# Patient Record
Sex: Male | Born: 2000 | Hispanic: No | Marital: Single | Smoking: Never smoker
Health system: Southern US, Community
[De-identification: ages and names within clinical notes are randomized; demographics above are authoritative.]

## PROBLEM LIST (undated history)

## (undated) DIAGNOSIS — Z789 Other specified health status: Secondary | ICD-10-CM

## (undated) HISTORY — PX: NO PAST SURGERIES: SHX2092

---
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
## 2020-04-13 ENCOUNTER — Telehealth: Payer: Self-pay | Admitting: Orthopaedic Surgery

## 2020-04-13 NOTE — Telephone Encounter (Signed)
Hey. Tried to call patient's mother back. No answer.  I'm not sure what the patient is needing to be scheduled for, but please schedule with the provider of patient's choice. Patient has not been seen with our practice in over 3 years.

## 2020-04-13 NOTE — Telephone Encounter (Signed)
Patient's mom Siddiga called stating her son sees Dr. Cleophas Dunker. I did not make an appt for [facility]. I only see Dr. August Saucer and Dr. Lajoyce Corners on patient's chart. Please call patient about this matter at [phone].

## 2020-04-23 ENCOUNTER — Ambulatory Visit: Payer: Medicaid Other | Admitting: Orthopaedic Surgery

## 2020-04-29 ENCOUNTER — Other Ambulatory Visit: Payer: Self-pay

## 2020-04-29 ENCOUNTER — Encounter: Payer: Self-pay | Admitting: Orthopaedic Surgery

## 2020-04-29 ENCOUNTER — Ambulatory Visit: Payer: Self-pay

## 2020-04-29 ENCOUNTER — Ambulatory Visit (INDEPENDENT_AMBULATORY_CARE_PROVIDER_SITE_OTHER): Payer: Medicaid Other | Admitting: Orthopaedic Surgery

## 2020-04-29 VITALS — Ht 71.0 in | Wt 180.0 lb

## 2020-04-29 DIAGNOSIS — M25562 Pain in left knee: Secondary | ICD-10-CM | POA: Diagnosis not present

## 2020-04-29 DIAGNOSIS — M542 Cervicalgia: Secondary | ICD-10-CM

## 2020-04-29 NOTE — Progress Notes (Signed)
Office Visit Note   Patient: Jose Dennis           Date of Birth: Nov 21, 2000           MRN: [id number] Visit Date: 04/29/2020              Requested by: No referring provider defined for this encounter. PCP: Pcp, No   Assessment & Plan: Visit Diagnoses:  1. Neck pain   2. Acute pain of left knee     Plan: Acute onset left knee pain after playing soccer and late October.  Initially seen at [facility] with negative x-rays.  Continues to have pain with certain activities and a feeling of instability.  I suspect he may have a tear of the ACL and will order an MRI scan  Follow-Up Instructions: Return After MRI scan left knee.   Orders:  Orders Placed This Encounter  Procedures  . MR Knee Left w/o contrast   No orders of the defined types were placed in this encounter.     Procedures: No procedures performed   Clinical Data: No additional findings.   Subjective: Chief Complaint  Patient presents with  . Left Knee - Pain  Patient presents today for left knee pain. He said that one month ago he was playing soccer. He came to a hard stop and the goal keeper hit his knee. He had immediate pain. He continues to have pain laterally with flexion or jumping. He did go to an urgent care in [place] and had x-rays taken. He was told it was all normal. He is not taking anything for pain. He does not want to repeat his x-rays today.  Attends [employer].  Had some pain after his initial injury playing soccer but worse the day after.  He did have initial swelling.  Now the swelling has subsided but he just does not "trust" his left knee.  No problems with his knee prior to this injury  HPI  Review of Systems   Objective: Vital Signs: Ht 5\' 11"  (1.803 m)   Wt 180 lb (81.6 kg)   BMI 25.10 kg/m   Physical Exam Constitutional:      Appearance: He is well-developed and well-nourished.  HENT:     Mouth/Throat:     Mouth: Oropharynx is clear and moist.  Eyes:      Extraocular Movements: EOM normal.     Pupils: Pupils are equal, round, and reactive to light.  Pulmonary:     Effort: Pulmonary effort is normal.  Skin:    General: Skin is warm and dry.  Neurological:     Mental Status: He is alert and oriented to person, place, and time.  Psychiatric:        Mood and Affect: Mood and affect normal.        Behavior: Behavior normal.     Ortho Exam awake alert in no distress.  Left knee without effusion.  No opening with varus valgus stress.  A little bit of tenderness along the lateral joint.  I thought he had a slightly more anterior drawer sign and Lachman's than on the right knee.  Negative pivot shift.  No popliteal pain or mass.  No calf pain.  No patella pain  Specialty Comments:  No specialty comments available.  Imaging: No results found.   PMFS History: Patient Active Problem List   Diagnosis Date Noted  . Pain in left knee 04/29/2020  . Pain in left foot 07/07/2016   History  reviewed. No pertinent past medical history.  History reviewed. No pertinent family history.  History reviewed. No pertinent surgical history. Social History   Occupational History  . Not on file  Tobacco Use  . Smoking status: Never Smoker  . Smokeless tobacco: Never Used  Substance and Sexual Activity  . Alcohol use: No  . Drug use: No  . Sexual activity: Not on file

## 2020-05-21 ENCOUNTER — Ambulatory Visit
Admission: RE | Admit: 2020-05-21 | Discharge: 2020-05-21 | Disposition: A | Discharge status: Home / Self Care | Payer: Medicaid Other | Source: Ambulatory Visit | Attending: Orthopaedic Surgery | Admitting: Orthopaedic Surgery

## 2020-05-21 ENCOUNTER — Other Ambulatory Visit: Payer: Self-pay

## 2020-05-21 DIAGNOSIS — M25562 Pain in left knee: Secondary | ICD-10-CM

## 2020-05-28 ENCOUNTER — Ambulatory Visit (INDEPENDENT_AMBULATORY_CARE_PROVIDER_SITE_OTHER): Payer: Medicaid Other | Admitting: Orthopaedic Surgery

## 2020-05-28 ENCOUNTER — Other Ambulatory Visit: Payer: Self-pay

## 2020-05-28 ENCOUNTER — Encounter: Payer: Self-pay | Admitting: Orthopaedic Surgery

## 2020-05-28 DIAGNOSIS — S83512D Sprain of anterior cruciate ligament of left knee, subsequent encounter: Secondary | ICD-10-CM | POA: Diagnosis not present

## 2020-06-05 ENCOUNTER — Ambulatory Visit (INDEPENDENT_AMBULATORY_CARE_PROVIDER_SITE_OTHER): Payer: Medicaid Other | Admitting: Orthopedic Surgery

## 2020-06-05 ENCOUNTER — Encounter: Payer: Self-pay | Admitting: Surgical

## 2020-06-05 DIAGNOSIS — S83207D Unspecified tear of unspecified meniscus, current injury, left knee, subsequent encounter: Secondary | ICD-10-CM

## 2020-06-05 DIAGNOSIS — S83512D Sprain of anterior cruciate ligament of left knee, subsequent encounter: Secondary | ICD-10-CM

## 2020-06-06 ENCOUNTER — Encounter: Payer: Self-pay | Admitting: Orthopedic Surgery

## 2020-06-06 NOTE — Progress Notes (Signed)
Office Visit Note   Patient: Jose Dennis           Date of Birth: 12-14-2000           MRN: [id number] Visit Date: 06/05/2020 Requested by: No referring provider defined for this encounter. PCP: Pcp, No  Subjective: Chief Complaint  Patient presents with  . Left Knee - Pain    HPI: Jose Dennis is a 20 year old patient injured his knee playing soccer in October.  He has had several episodes of symptomatic instability since that time.  This was a contact injury.  He did hear a pop.  He has played some pickup basketball since that time but is able to diminish his level of aggression to keep his knee functional.  Denies any personal or family history of DVT or pulmonary embolism.  Currently he is a Consulting civil engineer at [employer].              ROS: All systems reviewed are negative as they relate to the chief complaint within the history of present illness.  Patient denies  fevers or chills.   Assessment & Plan: Visit Diagnoses:  1. Tears of meniscus and anterior cruciate ligament of left knee, subsequent encounter     Plan: Impression is left knee pain with ACL deficiency laxity and possible meniscal injury.  Review of the MRI scan shows slightly underwhelming evidence of meniscal injury although there is a little bit of signal there.  More compelling is his knee laxity on exam clinically and MRI scan evidence of ACL tear.  Discussed the risk and benefits of surgical intervention.  In general for young patient who is interested in continuing with high risk sports such as soccer and basketball ACL reconstruction is indicated.  Risk and benefits are discussed including not limited to infection nerve vessel damage knee stiffness incomplete healing as well as the prolonged recovery required.  And Jose Dennis's case I would favor quad autograft versus hamstring autograft.  I think quad autograft would be his best option.  Plan for surgery sometime at the completion of his school year.  In the meantime  I cautioned him extensively about doing any type of high risk cutting and pivoting activity.  I will see him back 7 days after the procedure.  Follow-Up Instructions: No follow-ups on file.   Orders:  No orders of the defined types were placed in this encounter.  No orders of the defined types were placed in this encounter.     Procedures: No procedures performed   Clinical Data: No additional findings.  Objective: Vital Signs: There were no vitals taken for this visit.  Physical Exam:   Constitutional: Patient appears well-developed HEENT:  Head: Normocephalic Eyes:EOM are normal Neck: Normal range of motion Cardiovascular: Normal rate Pulmonary/chest: Effort normal Neurologic: Patient is alert Skin: Skin is warm Psychiatric: Patient has normal mood and affect    Ortho Exam: Ortho exam demonstrates full range of motion of the left knee.  Does have ACL laxity with positive Lachman positive drawer.  No posterior lateral rotatory instability is noted.  Pedal pulse palpable.  Ankle dorsiflexion intact.  Collaterals are stable to varus valgus stress at 0 30 and 90 degrees.  No real focal joint line tenderness.  Specialty Comments:  No specialty comments available.  Imaging: No results found.   PMFS History: Patient Active Problem List   Diagnosis Date Noted  . Pain in left knee 04/29/2020  . Pain in left foot 07/07/2016   History reviewed. No  pertinent past medical history.  History reviewed. No pertinent family history.  History reviewed. No pertinent surgical history. Social History   Occupational History  . Not on file  Tobacco Use  . Smoking status: Never Smoker  . Smokeless tobacco: Never Used  Substance and Sexual Activity  . Alcohol use: No  . Drug use: No  . Sexual activity: Not on file

## 2020-06-09 DIAGNOSIS — S83512A Sprain of anterior cruciate ligament of left knee, initial encounter: Secondary | ICD-10-CM | POA: Insufficient documentation

## 2020-09-01 ENCOUNTER — Other Ambulatory Visit: Payer: Self-pay

## 2020-09-22 ENCOUNTER — Other Ambulatory Visit (HOSPITAL_COMMUNITY): Payer: Medicaid Other

## 2020-09-23 ENCOUNTER — Encounter (HOSPITAL_COMMUNITY): Payer: Self-pay | Admitting: Orthopedic Surgery

## 2020-09-23 NOTE — Anesthesia Preprocedure Evaluation (Addendum)
Anesthesia Evaluation  Patient identified by MRN, date of birth, ID band Patient awake    Reviewed: Allergy & Precautions, NPO status , Patient's Chart, lab work & pertinent test results  History of Anesthesia Complications Negative for: history of anesthetic complications  Airway Mallampati: II  TM Distance: >3 FB Neck ROM: Full    Dental no notable dental hx.    Pulmonary neg pulmonary ROS, Patient abstained from smoking.,    Pulmonary exam normal        Cardiovascular negative cardio ROS Normal cardiovascular exam     Neuro/Psych negative neurological ROS  negative psych ROS   GI/Hepatic negative GI ROS, Neg liver ROS,   Endo/Other  negative endocrine ROS  Renal/GU negative Renal ROS  negative genitourinary   Musculoskeletal Left ACL tear   Abdominal   Peds  Hematology negative hematology ROS (+)   Anesthesia Other Findings Day of surgery medications reviewed with patient.  Reproductive/Obstetrics negative OB ROS                            Anesthesia Physical Anesthesia Plan  ASA: I  Anesthesia Plan: General   Post-op Pain Management: GA combined w/ Regional for post-op pain   Induction: Intravenous  PONV Risk Score and Plan: 3 and Treatment may vary due to age or medical condition, Ondansetron, Dexamethasone and Midazolam  Airway Management Planned: Oral ETT  Additional Equipment: None  Intra-op Plan:   Post-operative Plan: Extubation in OR  Informed Consent: I have reviewed the patients History and Physical, chart, labs and discussed the procedure including the risks, benefits and alternatives for the proposed anesthesia with the patient or authorized representative who has indicated his/her understanding and acceptance.     Dental advisory given  Plan Discussed with: CRNA  Anesthesia Plan Comments:        Anesthesia Quick Evaluation

## 2020-09-23 NOTE — Progress Notes (Signed)
EKG: na CXR: na ECHO: denies Stress Test: denies Cardiac Cath: denies  Fasting Blood Sugar- na Checks Blood Sugar__na_ times a day  OSA/CPAP: No  ASA/Blood Thinner: No  No covid test needed  Anesthesia Review: No  Patient denies shortness of breath, fever, cough, and chest pain at PAT appointment.  Patient verbalized understanding of instructions provided today at the PAT appointment.  Patient asked to review instructions at home and day of surgery.

## 2020-09-24 ENCOUNTER — Other Ambulatory Visit: Payer: Self-pay

## 2020-09-24 ENCOUNTER — Ambulatory Visit (HOSPITAL_COMMUNITY): Payer: Medicaid Other | Admitting: Anesthesiology

## 2020-09-24 ENCOUNTER — Encounter (HOSPITAL_COMMUNITY)
Admission: RE | Disposition: A | Discharge status: Home / Self Care | Payer: Self-pay | Source: Home / Self Care | Attending: Orthopedic Surgery

## 2020-09-24 ENCOUNTER — Ambulatory Visit (HOSPITAL_COMMUNITY)
Admission: RE | Admit: 2020-09-24 | Discharge: 2020-09-24 | Disposition: A | Discharge status: Home / Self Care | Payer: Medicaid Other | Attending: Orthopedic Surgery | Admitting: Orthopedic Surgery

## 2020-09-24 ENCOUNTER — Encounter (HOSPITAL_COMMUNITY): Payer: Self-pay | Admitting: Orthopedic Surgery

## 2020-09-24 DIAGNOSIS — S83512A Sprain of anterior cruciate ligament of left knee, initial encounter: Secondary | ICD-10-CM | POA: Diagnosis present

## 2020-09-24 DIAGNOSIS — X58XXXA Exposure to other specified factors, initial encounter: Secondary | ICD-10-CM | POA: Diagnosis not present

## 2020-09-24 DIAGNOSIS — S83512D Sprain of anterior cruciate ligament of left knee, subsequent encounter: Secondary | ICD-10-CM

## 2020-09-24 DIAGNOSIS — S83282A Other tear of lateral meniscus, current injury, left knee, initial encounter: Secondary | ICD-10-CM | POA: Diagnosis not present

## 2020-09-24 DIAGNOSIS — S83282D Other tear of lateral meniscus, current injury, left knee, subsequent encounter: Secondary | ICD-10-CM

## 2020-09-24 DIAGNOSIS — S83242D Other tear of medial meniscus, current injury, left knee, subsequent encounter: Secondary | ICD-10-CM

## 2020-09-24 DIAGNOSIS — S83242A Other tear of medial meniscus, current injury, left knee, initial encounter: Secondary | ICD-10-CM | POA: Insufficient documentation

## 2020-09-24 DIAGNOSIS — Z20822 Contact with and (suspected) exposure to covid-19: Secondary | ICD-10-CM | POA: Insufficient documentation

## 2020-09-24 HISTORY — DX: Other specified health status: Z78.9

## 2020-09-24 HISTORY — PX: ANTERIOR CRUCIATE LIGAMENT REPAIR: SHX115

## 2020-09-24 LAB — CBC
HCT: 43 % (ref 39.0–52.0)
Hemoglobin: 14.9 g/dL (ref 13.0–17.0)
MCH: 31.2 pg (ref 26.0–34.0)
MCHC: 34.7 g/dL (ref 30.0–36.0)
MCV: 90 fL (ref 80.0–100.0)
Platelets: 236 10*3/uL (ref 150–400)
RBC: 4.78 MIL/uL (ref 4.22–5.81)
RDW: 11.9 % (ref 11.5–15.5)
WBC: 7 10*3/uL (ref 4.0–10.5)
nRBC: 0 % (ref 0.0–0.2)

## 2020-09-24 LAB — BASIC METABOLIC PANEL
Anion gap: 6 (ref 5–15)
BUN: 11 mg/dL (ref 6–20)
CO2: 22 mmol/L (ref 22–32)
Calcium: 8.8 mg/dL — ABNORMAL LOW (ref 8.9–10.3)
Chloride: 108 mmol/L (ref 98–111)
Creatinine, Ser: 0.71 mg/dL (ref 0.61–1.24)
GFR, Estimated: >60 mL/min (ref >60)
Glucose, Bld: 93 mg/dL (ref 70–99)
Potassium: 4.1 mmol/L (ref 3.5–5.1)
Sodium: 136 mmol/L (ref 135–145)

## 2020-09-24 LAB — RESP PANEL BY RT-PCR (FLU A&B, COVID) ARPGX2
Influenza A by PCR: NEGATIVE
Influenza B by PCR: NEGATIVE
SARS Coronavirus 2 by RT PCR: NEGATIVE

## 2020-09-24 SURGERY — RECONSTRUCTION, KNEE, ACL, USING HAMSTRING GRAFT
Anesthesia: General | Laterality: Left

## 2020-09-24 MED ORDER — BUPIVACAINE HCL 0.25 % IJ SOLN
INTRAMUSCULAR | Status: DC | PRN
  Administered 2020-09-24 (×2): 10 mL

## 2020-09-24 MED ORDER — EPINEPHRINE PF 1 MG/ML IJ SOLN
INTRAMUSCULAR | Status: AC
  Filled 2020-09-24: qty 4

## 2020-09-24 MED ORDER — EPINEPHRINE PF 1 MG/ML IJ SOLN
INTRAMUSCULAR | Status: AC
  Filled 2020-09-24: qty 2

## 2020-09-24 MED ORDER — BUPIVACAINE HCL (PF) 0.25 % IJ SOLN
INTRAMUSCULAR | Status: AC
  Filled 2020-09-24: qty 20

## 2020-09-24 MED ORDER — SODIUM CHLORIDE 0.9 % IR SOLN
Status: DC | PRN
  Administered 2020-09-24 (×3): 3000 mL

## 2020-09-24 MED ORDER — CEFAZOLIN SODIUM-DEXTROSE 2-4 GM/100ML-% IV SOLN
2.0000 g | INTRAVENOUS | Status: AC
  Administered 2020-09-24: 2 g via INTRAVENOUS
  Filled 2020-09-24: qty 100

## 2020-09-24 MED ORDER — POVIDONE-IODINE 10 % EX SWAB: 2.0000 "application " | Freq: Once | CUTANEOUS | Status: DC

## 2020-09-24 MED ORDER — KETOROLAC TROMETHAMINE 10 MG PO TABS: 10.0000 mg | ORAL_TABLET | Freq: Three times a day (TID) | ORAL | 0 refills | Status: AC | PRN

## 2020-09-24 MED ORDER — SODIUM CHLORIDE 0.9 % IR SOLN
Status: DC | PRN
  Administered 2020-09-24 (×4): 1 mL

## 2020-09-24 MED ORDER — VANCOMYCIN HCL 500 MG IV SOLR
INTRAVENOUS | Status: AC
  Filled 2020-09-24: qty 500

## 2020-09-24 MED ORDER — LIDOCAINE 2% (20 MG/ML) 5 ML SYRINGE
INTRAMUSCULAR | Status: DC | PRN
  Administered 2020-09-24: 100 mg via INTRAVENOUS

## 2020-09-24 MED ORDER — LIDOCAINE 2% (20 MG/ML) 5 ML SYRINGE
INTRAMUSCULAR | Status: AC
  Filled 2020-09-24: qty 5

## 2020-09-24 MED ORDER — BUPIVACAINE-EPINEPHRINE 0.25% -1:200000 IJ SOLN
INTRAMUSCULAR | Status: DC | PRN
  Administered 2020-09-24: 10 mL

## 2020-09-24 MED ORDER — DIPHENHYDRAMINE HCL 50 MG/ML IJ SOLN
INTRAMUSCULAR | Status: DC | PRN
  Administered 2020-09-24: 12.5 mg via INTRAVENOUS

## 2020-09-24 MED ORDER — VANCOMYCIN HCL 1000 MG IV SOLR
INTRAVENOUS | Status: AC
  Filled 2020-09-24: qty 1000

## 2020-09-24 MED ORDER — ROCURONIUM BROMIDE 10 MG/ML (PF) SYRINGE
PREFILLED_SYRINGE | INTRAVENOUS | Status: DC | PRN
  Administered 2020-09-24: 60 mg via INTRAVENOUS

## 2020-09-24 MED ORDER — POVIDONE-IODINE 10 % EX SWAB
2.0000 "application " | Freq: Once | CUTANEOUS | Status: AC
  Administered 2020-09-24: 2 via TOPICAL

## 2020-09-24 MED ORDER — POVIDONE-IODINE 7.5 % EX SOLN
Freq: Once | CUTANEOUS | Status: DC
  Filled 2020-09-24: qty 118

## 2020-09-24 MED ORDER — DIPHENHYDRAMINE HCL 50 MG/ML IJ SOLN
INTRAMUSCULAR | Status: AC
  Filled 2020-09-24: qty 1

## 2020-09-24 MED ORDER — HYDROMORPHONE HCL 1 MG/ML IJ SOLN: 0.2500 mg | INTRAMUSCULAR | Status: DC | PRN

## 2020-09-24 MED ORDER — BUPIVACAINE-EPINEPHRINE (PF) 0.5% -1:200000 IJ SOLN
INTRAMUSCULAR | Status: DC | PRN
  Administered 2020-09-24: 15 mL via PERINEURAL

## 2020-09-24 MED ORDER — 0.9 % SODIUM CHLORIDE (POUR BTL) OPTIME
TOPICAL | Status: DC | PRN
  Administered 2020-09-24: 1000 mL

## 2020-09-24 MED ORDER — MORPHINE SULFATE (PF) 4 MG/ML IV SOLN
INTRAVENOUS | Status: DC | PRN
  Administered 2020-09-24: 8 mg via INTRAVENOUS

## 2020-09-24 MED ORDER — OXYCODONE-ACETAMINOPHEN 5-325 MG PO TABS: 1.0000 | ORAL_TABLET | ORAL | 0 refills | Status: AC | PRN

## 2020-09-24 MED ORDER — CLONIDINE HCL (ANALGESIA) 100 MCG/ML EP SOLN
EPIDURAL | Status: AC
  Filled 2020-09-24: qty 10

## 2020-09-24 MED ORDER — ASPIRIN 81 MG PO CHEW: 81.0000 mg | CHEWABLE_TABLET | Freq: Every day | ORAL | 0 refills | Status: AC

## 2020-09-24 MED ORDER — ORAL CARE MOUTH RINSE: 15.0000 mL | Freq: Once | OROMUCOSAL | Status: AC

## 2020-09-24 MED ORDER — EPINEPHRINE PF 1 MG/ML IJ SOLN: INTRAMUSCULAR | Status: DC | PRN

## 2020-09-24 MED ORDER — CLONIDINE HCL (ANALGESIA) 100 MCG/ML EP SOLN
EPIDURAL | Status: DC | PRN
  Administered 2020-09-24: 1 mL

## 2020-09-24 MED ORDER — IRRISEPT - 450ML BOTTLE WITH 0.05% CHG IN STERILE WATER, USP 99.95% OPTIME
TOPICAL | Status: DC | PRN
  Administered 2020-09-24: 450 mL

## 2020-09-24 MED ORDER — FENTANYL CITRATE (PF) 100 MCG/2ML IJ SOLN: 50.0000 ug | Freq: Once | INTRAMUSCULAR | Status: AC

## 2020-09-24 MED ORDER — MIDAZOLAM HCL 2 MG/2ML IJ SOLN
INTRAMUSCULAR | Status: AC
  Filled 2020-09-24: qty 2

## 2020-09-24 MED ORDER — METHOCARBAMOL 500 MG PO TABS: 500.0000 mg | ORAL_TABLET | Freq: Three times a day (TID) | ORAL | 0 refills | Status: AC | PRN

## 2020-09-24 MED ORDER — CHLORHEXIDINE GLUCONATE 0.12 % MT SOLN
15.0000 mL | Freq: Once | OROMUCOSAL | Status: AC
  Administered 2020-09-24: 15 mL via OROMUCOSAL
  Filled 2020-09-24: qty 15

## 2020-09-24 MED ORDER — ROCURONIUM BROMIDE 10 MG/ML (PF) SYRINGE
PREFILLED_SYRINGE | INTRAVENOUS | Status: AC
  Filled 2020-09-24: qty 10

## 2020-09-24 MED ORDER — DEXAMETHASONE SODIUM PHOSPHATE 10 MG/ML IJ SOLN
INTRAMUSCULAR | Status: AC
  Filled 2020-09-24: qty 1

## 2020-09-24 MED ORDER — FENTANYL CITRATE (PF) 250 MCG/5ML IJ SOLN
INTRAMUSCULAR | Status: DC | PRN
  Administered 2020-09-24: 100 ug via INTRAVENOUS
  Administered 2020-09-24 (×2): 25 ug via INTRAVENOUS

## 2020-09-24 MED ORDER — PROPOFOL 10 MG/ML IV BOLUS
INTRAVENOUS | Status: DC | PRN
  Administered 2020-09-24: 200 mg via INTRAVENOUS

## 2020-09-24 MED ORDER — LACTATED RINGERS IV SOLN: INTRAVENOUS | Status: DC

## 2020-09-24 MED ORDER — PROMETHAZINE HCL 25 MG/ML IJ SOLN: 6.2500 mg | INTRAMUSCULAR | Status: DC | PRN

## 2020-09-24 MED ORDER — MIDAZOLAM HCL 2 MG/2ML IJ SOLN: 2.0000 mg | Freq: Once | INTRAMUSCULAR | Status: AC

## 2020-09-24 MED ORDER — ACETAMINOPHEN 500 MG PO TABS
1000.0000 mg | ORAL_TABLET | Freq: Once | ORAL | Status: AC
  Administered 2020-09-24: 1000 mg via ORAL
  Filled 2020-09-24: qty 2

## 2020-09-24 MED ORDER — DEXAMETHASONE SODIUM PHOSPHATE 10 MG/ML IJ SOLN
INTRAMUSCULAR | Status: DC | PRN
  Administered 2020-09-24: 5 mg via INTRAVENOUS

## 2020-09-24 MED ORDER — DEXMEDETOMIDINE (PRECEDEX) IN NS 20 MCG/5ML (4 MCG/ML) IV SYRINGE
PREFILLED_SYRINGE | INTRAVENOUS | Status: DC | PRN
  Administered 2020-09-24: 4 ug via INTRAVENOUS
  Administered 2020-09-24 (×2): 8 ug via INTRAVENOUS
  Administered 2020-09-24: 4 ug via INTRAVENOUS

## 2020-09-24 MED ORDER — CLONIDINE HCL (ANALGESIA) 100 MCG/ML EP SOLN
EPIDURAL | Status: DC | PRN
  Administered 2020-09-24: 100 ug

## 2020-09-24 MED ORDER — MIDAZOLAM HCL 5 MG/5ML IJ SOLN
INTRAMUSCULAR | Status: DC | PRN
  Administered 2020-09-24: 2 mg via INTRAVENOUS

## 2020-09-24 MED ORDER — MORPHINE SULFATE (PF) 4 MG/ML IV SOLN
INTRAVENOUS | Status: AC
  Filled 2020-09-24: qty 2

## 2020-09-24 MED ORDER — PROPOFOL 10 MG/ML IV BOLUS
INTRAVENOUS | Status: AC
  Filled 2020-09-24: qty 20

## 2020-09-24 MED ORDER — BUPIVACAINE-EPINEPHRINE (PF) 0.25% -1:200000 IJ SOLN
INTRAMUSCULAR | Status: AC
  Filled 2020-09-24: qty 30

## 2020-09-24 MED ORDER — SUGAMMADEX SODIUM 200 MG/2ML IV SOLN
INTRAVENOUS | Status: DC | PRN
  Administered 2020-09-24 (×2): 100 mg via INTRAVENOUS

## 2020-09-24 MED ORDER — FENTANYL CITRATE (PF) 250 MCG/5ML IJ SOLN
INTRAMUSCULAR | Status: AC
  Filled 2020-09-24: qty 5

## 2020-09-24 MED ORDER — ONDANSETRON HCL 4 MG/2ML IJ SOLN
INTRAMUSCULAR | Status: AC
  Filled 2020-09-24: qty 2

## 2020-09-24 MED ORDER — MIDAZOLAM HCL 2 MG/2ML IJ SOLN
INTRAMUSCULAR | Status: AC
  Administered 2020-09-24: 2 mg via INTRAVENOUS
  Filled 2020-09-24: qty 2

## 2020-09-24 MED ORDER — FENTANYL CITRATE (PF) 100 MCG/2ML IJ SOLN
INTRAMUSCULAR | Status: AC
  Administered 2020-09-24: 50 ug via INTRAVENOUS
  Filled 2020-09-24: qty 2

## 2020-09-24 MED ORDER — ONDANSETRON HCL 4 MG/2ML IJ SOLN
INTRAMUSCULAR | Status: DC | PRN
  Administered 2020-09-24: 4 mg via INTRAVENOUS

## 2020-09-24 MED ORDER — EPINEPHRINE PF 1 MG/ML IJ SOLN
INTRAMUSCULAR | Status: AC
  Filled 2020-09-24: qty 1

## 2020-09-24 SURGICAL SUPPLY — 107 items
ALCOHOL 70% 16 OZ (MISCELLANEOUS) ×2 IMPLANT
BANDAGE ESMARK 6X9 LF (GAUZE/BANDAGES/DRESSINGS) IMPLANT
BLADE SURG 10 STRL SS (BLADE) ×2 IMPLANT
BLADE SURG 15 STRL LF DISP TIS (BLADE) ×2 IMPLANT
BLADE SURG 15 STRL SS (BLADE) ×4
BNDG ELASTIC 6X15 VLCR STRL LF (GAUZE/BANDAGES/DRESSINGS) ×2 IMPLANT
BNDG ESMARK 6X9 LF (GAUZE/BANDAGES/DRESSINGS)
BURR OVAL 8 FLU 4.0X13 (MISCELLANEOUS) IMPLANT
CANN ZONE NAVIGATOR LT DISP (ORTHOPEDIC DISPOSABLE SUPPLIES) ×1
CANNULA ZONE NAVIGATOR LT DISP (ORTHOPEDIC DISPOSABLE SUPPLIES) ×1 IMPLANT
COVER MAYO STAND STRL (DRAPES) ×2 IMPLANT
COVER SURGICAL LIGHT HANDLE (MISCELLANEOUS) ×2 IMPLANT
COVER WAND RF STERILE (DRAPES) ×2 IMPLANT
CUFF TOURN SGL QUICK 34 (TOURNIQUET CUFF)
CUFF TRNQT CYL 34X4.125X (TOURNIQUET CUFF) IMPLANT
CUTTER TENSIONER SUT 2-0 0 FBW (INSTRUMENTS) ×2 IMPLANT
DECANTER SPIKE VIAL GLASS SM (MISCELLANEOUS) IMPLANT
DISSECTOR 4.0MM X 13CM (MISCELLANEOUS) ×2 IMPLANT
DRAPE ARTHROSCOPY W/POUCH 114 (DRAPES) ×2 IMPLANT
DRAPE INCISE IOBAN 66X45 STRL (DRAPES) ×2 IMPLANT
DRAPE OEC MINIVIEW 54X84 (DRAPES) ×2 IMPLANT
DRAPE ORTHO SPLIT 77X108 STRL (DRAPES) ×2
DRAPE SURG ORHT 6 SPLT 77X108 (DRAPES) ×1 IMPLANT
DRAPE U-SHAPE 47X51 STRL (DRAPES) ×2 IMPLANT
DRILL FLIPCUTTER II 7.5MM (MISCELLANEOUS) IMPLANT
DRILL FLIPCUTTER II 8.0MM (INSTRUMENTS) IMPLANT
DRILL FLIPCUTTER II 8.5MM (INSTRUMENTS) IMPLANT
DRILL FLIPCUTTER II 9.0MM (INSTRUMENTS) IMPLANT
DRILL FLIPCUTTER III 6-12 (ORTHOPEDIC DISPOSABLE SUPPLIES) ×1 IMPLANT
DRSG PAD ABDOMINAL 8X10 ST (GAUZE/BANDAGES/DRESSINGS) IMPLANT
DRSG TEGADERM 4X4.75 (GAUZE/BANDAGES/DRESSINGS) ×14 IMPLANT
DURAPREP 26ML APPLICATOR (WOUND CARE) ×4 IMPLANT
DW OUTFLOW CASSETTE/TUBE SET (MISCELLANEOUS) ×2 IMPLANT
ELECT REM PT RETURN 9FT ADLT (ELECTROSURGICAL) ×2
ELECTRODE REM PT RTRN 9FT ADLT (ELECTROSURGICAL) ×1 IMPLANT
FIBER BONE ALLOSYNC EXPAND 5 (Bone Implant) ×2 IMPLANT
FLIPCUTTER II 7.5MM (MISCELLANEOUS)
FLIPCUTTER II 8.0MM (INSTRUMENTS)
FLIPCUTTER II 8.5MM (INSTRUMENTS)
FLIPCUTTER II 9.0MM (INSTRUMENTS)
FLIPCUTTER III 6-12 AR-1204FF (ORTHOPEDIC DISPOSABLE SUPPLIES) ×2
GAUZE SPONGE 4X4 12PLY STRL LF (GAUZE/BANDAGES/DRESSINGS) ×2 IMPLANT
GAUZE XEROFORM 1X8 LF (GAUZE/BANDAGES/DRESSINGS) ×4 IMPLANT
GLOVE ECLIPSE 8.0 STRL XLNG CF (GLOVE) ×2 IMPLANT
GLOVE ORTHO TXT STRL SZ7.5 (GLOVE) ×2 IMPLANT
GLOVE SRG 8 PF TXTR STRL LF DI (GLOVE) ×1 IMPLANT
GLOVE SURG UNDER POLY LF SZ8 (GLOVE) ×2
GOWN STRL REUS W/ TWL LRG LVL3 (GOWN DISPOSABLE) ×3 IMPLANT
GOWN STRL REUS W/TWL LRG LVL3 (GOWN DISPOSABLE) ×6
HANDLE ZONE NAVIGATOR (ORTHOPEDIC DISPOSABLE SUPPLIES) ×1 IMPLANT
IMMOBILIZER KNEE 22 UNIV (SOFTGOODS) ×2 IMPLANT
IMPL FIBERSTICH 2-0 CVD (Anchor) ×1 IMPLANT
IMPL TIGHTROP ABS ACL FIBERTG (Orthopedic Implant) ×1 IMPLANT
IMPL TIGHTROP FIBERTAG ACL (Orthopedic Implant) ×1 IMPLANT
IMPL TIGHTROPE ABS ACL FIBERTG (Orthopedic Implant) ×2 IMPLANT
IMPLANT FIBERSTICH 2-0 CVD (Anchor) ×2 IMPLANT
IMPLANT TIGHTROPE FIBERTAG ACL (Orthopedic Implant) ×2 IMPLANT
KIT BASIN OR (CUSTOM PROCEDURE TRAY) ×2 IMPLANT
KIT BIOCARTILAGE DEL W/SYRINGE (KITS) IMPLANT
KIT BIOCARTILAGE LG JOINT MIX (KITS) ×2 IMPLANT
KIT BUTTON TIGHTROPE ABS 8X12 (Anchor) ×2 IMPLANT
KIT ROOT REPAIR MEINISCAL PEEK (Anchor) ×1 IMPLANT
KIT TURNOVER KIT B (KITS) ×2 IMPLANT
KNIFE GRAFT ACL 9MM (MISCELLANEOUS) ×2 IMPLANT
MANIFOLD NEPTUNE II (INSTRUMENTS) ×2 IMPLANT
MEINISCAL ROOT REPAIR KIT PEEK (Anchor) ×2 IMPLANT
MID POST LF CANNULA AR7910L (ORTHOPEDIC DISPOSABLE SUPPLIES) ×2
NEEDLE 18GX1X1/2 (RX/OR ONLY) (NEEDLE) ×2 IMPLANT
NEEDLE HYPO 18GX1.5 BLUNT FILL (NEEDLE) ×2 IMPLANT
NS IRRIG 1000ML POUR BTL (IV SOLUTION) ×2 IMPLANT
PACK ARTHROSCOPY DSU (CUSTOM PROCEDURE TRAY) ×2 IMPLANT
PAD ABD 8X10 STRL (GAUZE/BANDAGES/DRESSINGS) ×2 IMPLANT
PAD ARMBOARD 7.5X6 YLW CONV (MISCELLANEOUS) ×4 IMPLANT
PAD CAST 4YDX4 CTTN HI CHSV (CAST SUPPLIES) IMPLANT
PAD COLD SHLDR UNI WRAP-ON (PAD) ×2
PAD COLD UNI WRAP-ON (PAD) ×1 IMPLANT
PADDING CAST COTTON 4X4 STRL (CAST SUPPLIES)
PADDING CAST COTTON 6X4 STRL (CAST SUPPLIES) ×4 IMPLANT
PENCIL BUTTON HOLSTER BLD 10FT (ELECTRODE) ×2 IMPLANT
PORTAL SKID DEVICE (INSTRUMENTS) ×2 IMPLANT
SPONGE LAP 18X18 RF (DISPOSABLE) ×2 IMPLANT
SPONGE LAP 4X18 RFD (DISPOSABLE) ×4 IMPLANT
STRIP CLOSURE SKIN 1/2X4 (GAUZE/BANDAGES/DRESSINGS) ×2 IMPLANT
SUCTION FRAZIER HANDLE 10FR (MISCELLANEOUS) ×2
SUCTION TUBE FRAZIER 10FR DISP (MISCELLANEOUS) ×1 IMPLANT
SUT ETHILON 3 0 PS 1 (SUTURE) ×6 IMPLANT
SUT MNCRL AB 3-0 PS2 18 (SUTURE) ×4 IMPLANT
SUT VIC AB 0 CT1 27 (SUTURE) ×4
SUT VIC AB 0 CT1 27XBRD ANBCTR (SUTURE) ×2 IMPLANT
SUT VIC AB 1 CT1 27 (SUTURE) ×6
SUT VIC AB 1 CT1 27XBRD ANTBC (SUTURE) ×3 IMPLANT
SUT VIC AB 2-0 CT1 27 (SUTURE) ×6
SUT VIC AB 2-0 CT1 TAPERPNT 27 (SUTURE) ×3 IMPLANT
SUT VICRYL 0 UR6 27IN ABS (SUTURE) ×4 IMPLANT
SUTURE TAPE 2-0 MENISCS NDL (SUTURE) ×5 IMPLANT
SUTURETAPE 2-0 MENISCS NDL (SUTURE) ×10
SYR 30ML LL (SYRINGE) ×2 IMPLANT
SYR 3ML LL SCALE MARK (SYRINGE) ×2 IMPLANT
SYR BULB IRRIG 60ML STRL (SYRINGE) ×2 IMPLANT
SYR TB 1ML LUER SLIP (SYRINGE) ×2 IMPLANT
TOWEL GREEN STERILE (TOWEL DISPOSABLE) ×2 IMPLANT
TOWEL GREEN STERILE FF (TOWEL DISPOSABLE) ×2 IMPLANT
TUBING ARTHROSCOPY IRRIG 16FT (MISCELLANEOUS) ×2 IMPLANT
UNDERPAD 30X36 HEAVY ABSORB (UNDERPADS AND DIAPERS) ×2 IMPLANT
WRAP KNEE MAXI GEL POST OP (GAUZE/BANDAGES/DRESSINGS) IMPLANT
YANKAUER SUCT BULB TIP NO VENT (SUCTIONS) ×2 IMPLANT
ZONE NAVIGATOR-HANDLE AR7900 (ORTHOPEDIC DISPOSABLE SUPPLIES) ×2

## 2020-09-24 NOTE — Transfer of Care (Signed)
Immediate Anesthesia Transfer of Care Note  Patient: Jose Dennis  Procedure(s) Performed: LEFT KNEE ARTHROSCOPY, ACL RECONSTRUCTION WITH QUAD AUTOGRAFT (Left )  Patient Location: PACU  Anesthesia Type:GA combined with regional for post-op pain  Level of Consciousness: drowsy and responds to stimulation  Airway & Oxygen Therapy: Patient Spontanous Breathing and Patient connected to nasal cannula oxygen  Post-op Assessment: Report given to RN and Post -op Vital signs reviewed and stable  Post vital signs: Reviewed and stable  Last Vitals:  Vitals Value Taken Time  BP 103/56 09/24/20 1658  Temp    Pulse 79 09/24/20 1701  Resp 19 09/24/20 1701  SpO2 91 % 09/24/20 1701  Vitals shown include unvalidated device data.  Last Pain:  Vitals:   09/24/20 1028  TempSrc:   PainSc: 7       Patients Stated Pain Goal: 3 (09/24/20 1028)  Complications: No complications documented.

## 2020-09-24 NOTE — Op Note (Signed)
Jose Dennis, Jose Dennis MEDICAL RECORD NO: [id number] ACCOUNT NO: [id number] DATE OF BIRTH: Aug 13, 2000 FACILITY: [facility] LOCATION: [facility] PHYSICIAN: Graylin Shiver. August Saucer, MD  Operative Report   DATE OF PROCEDURE: 09/24/2020  PREOPERATIVE DIAGNOSIS:  Left knee anterior cruciate ligament tear with possible medial meniscal tear.  POSTOPERATIVE DIAGNOSIS:  Left knee anterior cruciate ligament tear with medial meniscal tear through the peripheral zone as well as posterior horn lateral meniscal tear through the central zone of the meniscus.  PROCEDURE:  Left knee ACL reconstruction using quad autograft with inside-out medial meniscal repair and partial lateral meniscectomy.  SURGEON:  Graylin Shiver. August Saucer, MD  ASSISTANT:  Karenann Cai, PA  INDICATIONS:  The patient is a 20 year old patient who injured his knee in October.  He has been fairly active since that time.  MRI scan at that time suggested possible meniscal injury.  ACL was also completely torn.  He does report symptomatic  instability and presents for operative management after explanation of risks and benefits.  DESCRIPTION OF PROCEDURE:  Patient was brought to the operating room where general anesthetic was induced.  Preoperative antibiotics administered.  Timeout was called.  Left leg was examined under anesthesia and found to have full extension, full  flexion, good stability to varus and valgus stress at 0 and 30 degrees with no posterolateral rotatory instability.  ACL was out.  PCL was intact.  Positive Lachman.  Positive anterior drawer.  Left leg was prescrubbed with alcohol and Betadine and  allowed to air dry.  Prepped with DuraPrep solution and draped in a sterile manner including the foot.  Jose Dennis was used to cover the operative field.  Time out was called.  Anterior inferior lateral, anterior inferior medial portals were numbed with 5 mL  of Marcaine with epinephrine.  Next, an incision made at the proximal pole of the patella  extending proximally.  Skin and subcutaneous tissue were sharply divided.  The quad tendon was exposed.  The patient had a healthy appearing quad tendon, which  narrowed some proximally.  For that reason, a 9 mm graft knife was chosen.  A double wide knife was used to create a graft 75 mm in length.  The graft was then harvested and prepared on the back table by [facility] using Arth[employer] tool Endobutton  technique.  Concurrent with this, the incision was irrigated with IrriSept solution.  Tourniquet was utilized for this and then after quad closure with #1 Vicryl was performed, the tourniquet was released after 23 minutes.  Skin and subQ was then closed  using 0 Vicryl suture and 2-0 Vicryl suture.  Impervious dressing then placed over this incision.  Next, anterior inferior lateral anterior inferior medial portals were established.  Diagnostic arthroscopy was performed.  The patient had tear of the  posterior horn medial meniscus through the red-red zone.  This was prepared using a ball rasp.  The meniscus was moderately unstable, but could not be subluxated into the joint.  The tear extended about 2 cm.  This tear was prepared with the rasp as well  as abrading the parameniscal synovium.  Next, the lateral meniscus was inspected.  Tear in the posterior horn attachment involving about 10-15% anterior and posterior width and this was resected.  Notchplasty performed.  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  After  notchplasty was performed, the meniscus was repaired using inside-out suture zone specific cannulas from Arth[employer].  An incision was made at the posteromedial border of the knee.  Skin and subcutaneous tissue were sharply divided.  Layer 1 was divided and  the capsule was encountered.  A gray speculum was placed and the posterior neurovascular structures were protected.  Next, the inside-out sutures were placed, two vertical mattress sutures and one inferior horizontal mattress suture was placed to secure  the  tear.  This was supplemented with an Arthrex all-inside meniscal repair device to go slightly more medial.  This was set at 16 mm.  Secure repair was achieved.  Sutures were tied with the knee flexed about 30 degrees.  Next, the ACL tunnel was  drilled in the 3 o'clock position on the lateral femoral condyle.  Drilled inside out with the FlipCutter.  Tibial tunnel drilled in the posterior aspect of the native ACL footprint with a flip cutter as well.  The graft was then passed into the tunnel  was prepared with bone graft.  Secure fixation achieved, confirmed under fluoroscopy with flipping of the button.  Good knee was cycled through and then final tension applied in extension on the Endobutton on the tibial side.  Secure fixation was  achieved with a less than 1 mm Lachman in anterior drawer with the knee in extension.  Next, a SwiveLock was placed for backup fixation.  It should be noted that we utilized an internal brace for added stability.  Patient's knee joint was then thoroughly  irrigated.  Instruments were removed.  The incisions were also irrigated using IrriSept solution.  The portals were closed using 2-0 Vicryl, 3-0 nylon.  The medial side was closed using a 2-0 Vicryl and 3-0 Monocryl.  Harvest site closed using a 3-0  Monocryl for completion.  An impervious dressings with Xeroform were placed.  A solution of Marcaine, morphine and clonidine was injected into the knee for postop pain relief.  Bulky dressing and IceMan and knee immobilizer were placed.  Luke's  assistance was required at all times for retraction, opening, closing, suture removal graft preparation.  His assistance was medical necessity.   PUS D: 09/24/2020 5:02:15 pm T: 09/24/2020 6:32:00 pm  JOB: [id number]/ [id number]

## 2020-09-24 NOTE — Anesthesia Procedure Notes (Signed)
Procedure Name: Intubation Date/Time: 09/24/2020 12:50 PM Performed by: Trinna Post., CRNA Pre-anesthesia Checklist: Patient identified, Emergency Drugs available, Suction available, Patient being monitored and Timeout performed Patient Re-evaluated:Patient Re-evaluated prior to induction Oxygen Delivery Method: Circle system utilized Preoxygenation: Pre-oxygenation with 100% oxygen Induction Type: IV induction Ventilation: Mask ventilation without difficulty Laryngoscope Size: Mac and 4 Grade View: Grade I Tube type: Oral Tube size: 7.5 mm Number of attempts: 1 Airway Equipment and Method: Stylet Placement Confirmation: ETT inserted through vocal cords under direct vision,  positive ETCO2 and breath sounds checked- equal and bilateral Secured at: 22 cm Tube secured with: Tape Dental Injury: Teeth and Oropharynx as per pre-operative assessment

## 2020-09-24 NOTE — Progress Notes (Signed)
Orthopedic Tech Progress Note Patient Details:  Jose Dennis 19-Oct-2000 [id number]  Ortho Devices Type of Ortho Device: Crutches Ortho Device/Splint Interventions: Ordered   Post Interventions Patient Tolerated: Well Instructions Provided: Adjustment of device,Care of device,Poper ambulation with device   Gerald Stabs 09/24/2020, 6:29 PM

## 2020-09-24 NOTE — Anesthesia Procedure Notes (Signed)
Anesthesia Regional Block: Adductor canal block   Pre-Anesthetic Checklist: ,, timeout performed, Correct Patient, Correct Site, Correct Laterality, Correct Procedure, Correct Position, site marked, Risks and benefits discussed, pre-op evaluation,  At surgeon's request and post-op pain management  Laterality: Left  Prep: Maximum Sterile Barrier Precautions used, chloraprep       Needles:  Injection technique: Single-shot  Needle Type: Echogenic Stimulator Needle     Needle Length: 9cm  Needle Gauge: 22     Additional Needles:   Procedures:,,,, ultrasound used (permanent image in chart),,,,  Narrative:  Start time: 09/24/2020 12:16 PM End time: 09/24/2020 12:19 PM Injection made incrementally with aspirations every 5 mL.  Performed by: Personally  Anesthesiologist: Kaylyn Layer, MD  Additional Notes: Risks, benefits, and alternative discussed. Patient gave consent for procedure. Patient prepped and draped in sterile fashion. Sedation administered, patient remains easily responsive to voice. Relevant anatomy identified with ultrasound guidance. Local anesthetic given in 5cc increments with no signs or symptoms of intravascular injection. No pain or paraesthesias with injection. Patient monitored throughout procedure with signs of LAST or immediate complications. Tolerated well. Ultrasound image placed in chart.  Amalia Greenhouse, MD

## 2020-09-24 NOTE — H&P (Signed)
Jose Dennis is an 20 y.o. male.   Chief Complaint: Left knee pain and instability HPI: Jose Dennis is a 20 year old patient injured his knee playing soccer in October.  He has had several episodes of symptomatic instability since that time.  This was a contact injury.  He did hear a pop.  He has played some pickup basketball since that time but is able to diminish his level of aggression to keep his knee functional.  Denies any personal or family history of DVT or pulmonary embolism.  Currently he is a Consulting civil engineer at [employer].  Past Medical History:  Diagnosis Date  . Medical history non-contributory     Past Surgical History:  Procedure Laterality Date  . NO PAST SURGERIES      History reviewed. No pertinent family history. Social History:  reports that he has never smoked. He has never used smokeless tobacco. He reports that he does not drink alcohol and does not use drugs.  Allergies: No Known Allergies  Medications Prior to Admission  Medication Sig Dispense Refill  . acetaminophen (TYLENOL) 500 MG tablet Take 500-1,000 mg by mouth every 6 (six) hours as needed (for pain.).    Marland Kitchen ibuprofen (ADVIL) 200 MG tablet Take 200-400 mg by mouth every 8 (eight) hours as needed (for pain).      Results for orders placed or performed during the hospital encounter of 09/24/20 (from the past 48 hour(s))  CBC     Status: None   Collection Time: 09/24/20 10:48 AM  Result Value Ref Range   WBC 7.0 4.0 - 10.5 K/uL   RBC 4.78 4.22 - 5.81 MIL/uL   Hemoglobin 14.9 13.0 - 17.0 g/dL   HCT 63.8 75.6 - 43.3 %   MCV 90.0 80.0 - 100.0 fL   MCH 31.2 26.0 - 34.0 pg   MCHC 34.7 30.0 - 36.0 g/dL   RDW 29.5 18.8 - 41.6 %   Platelets 236 150 - 400 K/uL   nRBC 0.0 0.0 - 0.2 %    Comment: Performed at [facility], [address] [place], [place] [address]  Basic metabolic panel     Status: Abnormal   Collection Time: 09/24/20 10:48 AM  Result Value Ref Range   Sodium 136 135 - 145 mmol/L    Potassium 4.1 3.5 - 5.1 mmol/L   Chloride 108 98 - 111 mmol/L   CO2 22 22 - 32 mmol/L   Glucose, Bld 93 70 - 99 mg/dL    Comment: Glucose reference range applies only to samples taken after fasting for at least 8 hours.   BUN 11 6 - 20 mg/dL   Creatinine, Ser 1.60 0.61 - 1.24 mg/dL   Calcium 8.8 (L) 8.9 - 10.3 mg/dL   GFR, Estimated >10 >93 mL/min    Comment: (NOTE) Calculated using the CKD-EPI Creatinine Equation (2021)    Anion gap 6 5 - 15    Comment: Performed at [facility], [address], [place], [place] [address]   No results found.  Review of Systems  Musculoskeletal: Positive for arthralgias.  All other systems reviewed and are negative.   Blood pressure (!) 145/81, pulse 60, temperature 98 F (36.7 C), temperature source Oral, resp. rate 17, height 5\' 11"  (1.803 m), weight 88.9 kg, SpO2 100 %. Physical Exam Vitals reviewed.  HENT:     Head: Normocephalic.     Nose: Nose normal.     Mouth/Throat:     Mouth: Mucous membranes are moist.  Eyes:     Pupils: Pupils are equal, round, and reactive to light.  Cardiovascular:     Rate and Rhythm: Normal rate.     Pulses: Normal pulses.  Pulmonary:     Effort: Pulmonary effort is normal.  Abdominal:     General: Abdomen is flat.  Musculoskeletal:     Cervical back: Normal range of motion.  Skin:    General: Skin is warm.     Capillary Refill: Capillary refill takes less than 2 seconds.  Neurological:     General: No focal deficit present.     Mental Status: He is alert.  Psychiatric:        Mood and Affect: Mood normal.   Examination of the left knee demonstrates good range of motion with anterior laxity.  No posterior rotatory instability is noted.  Pedal pulses intact.  No masses lymphadenopathy or skin change on the left knee region.  Patient has full extension and full flexion.  Assessment/Plan   Impression is left knee ACL tear with possible meniscal pathology.  Plan is left knee ACL reconstruction  using quadriceps autograft.  Risk and benefits are discussed with the patient include not limited to infection nerve vessel damage knee stiffness potential for recurrent instability.  Patient understands risk and benefits and wishes to proceed.  All questions answered  Burnard Bunting, MD 09/24/2020, 11:50 AM

## 2020-09-24 NOTE — Brief Op Note (Signed)
   09/24/2020  4:52 PM  PATIENT:  Jose Dennis  20 y.o. male  PRE-OPERATIVE DIAGNOSIS:  left knee anterior cruciate ligament tear, lateral and medial meniscal tear  POST-OPERATIVE DIAGNOSIS:  left knee anterior cruciate ligament tear, lateral and medial meniscal tear  PROCEDURE:  Procedure(s): LEFT KNEE ARTHROSCOPY, ACL RECONSTRUCTION WITH QUAD AUTOGRAFT, partial lateral meniscectomy and medial meniscal repair  SURGEON:  Surgeon(s): August Saucer, Corrie Mckusick, MD  ASSISTANT: Karenann Cai, PA  ANESTHESIA:   general  EBL: 25 ml    Total I/O In: 1300 [I.V.:1200; IV Piggyback:100] Out: 50 [Blood:50]  BLOOD ADMINISTERED: none  DRAINS: none   LOCAL MEDICATIONS USED:    SPECIMEN:  No Specimen  COUNTS:  YES  TOURNIQUET:   Total Tourniquet Time Documented: Thigh (Left) - 24 minutes Total: Thigh (Left) - 24 minutes   DICTATION: .Other Dictation: Dictation Number done  PLAN OF CARE: Discharge to home after PACU  PATIENT DISPOSITION:  PACU - hemodynamically stable

## 2020-09-24 NOTE — Anesthesia Postprocedure Evaluation (Signed)
Anesthesia Post Note  Patient: Jose Dennis  Procedure(s) Performed: LEFT KNEE ARTHROSCOPY, ACL RECONSTRUCTION WITH QUAD AUTOGRAFT (Left )     Patient location during evaluation: PACU Anesthesia Type: General and Regional Level of consciousness: awake and alert Pain management: pain level controlled Vital Signs Assessment: post-procedure vital signs reviewed and stable Respiratory status: spontaneous breathing, nonlabored ventilation and respiratory function stable Cardiovascular status: blood pressure returned to baseline and stable Postop Assessment: no apparent nausea or vomiting Anesthetic complications: no   No complications documented.  Last Vitals:  Vitals:   09/24/20 1728 09/24/20 1743  BP: 102/62 101/60  Pulse: 70 63  Resp: 14 15  Temp:    SpO2: 96% 96%    Last Pain:  Vitals:   09/24/20 1728  TempSrc:   PainSc: Asleep                 [place] Dube,W. EDMOND

## 2020-09-25 ENCOUNTER — Telehealth: Payer: Self-pay

## 2020-09-25 NOTE — Telephone Encounter (Signed)
Patient had SU yesterday Left knee. Mom states he is a lot of pain.  States these meds help 50% and is unable to do CPM. Would like a CB.   +Oxycodone +Robaxin +Toradol  CB: [phone]

## 2020-09-25 NOTE — Telephone Encounter (Signed)
Please advise 

## 2020-09-27 DIAGNOSIS — S83282A Other tear of lateral meniscus, current injury, left knee, initial encounter: Secondary | ICD-10-CM

## 2020-09-27 DIAGNOSIS — S83242D Other tear of medial meniscus, current injury, left knee, subsequent encounter: Secondary | ICD-10-CM

## 2020-09-27 NOTE — Telephone Encounter (Signed)
Called this evening, doing okay, no persistent f/c/ns/drainage. Up to 50 degrees on CPM and pain is improving steadily.  Returning on Thursday, they will call office if any concerns in meantime

## 2020-09-28 ENCOUNTER — Encounter (HOSPITAL_COMMUNITY): Payer: Self-pay | Admitting: Orthopedic Surgery

## 2020-09-28 NOTE — Telephone Encounter (Signed)
noted 

## 2020-10-01 ENCOUNTER — Other Ambulatory Visit: Payer: Self-pay

## 2020-10-01 ENCOUNTER — Encounter: Payer: Self-pay | Admitting: Orthopedic Surgery

## 2020-10-01 ENCOUNTER — Ambulatory Visit (INDEPENDENT_AMBULATORY_CARE_PROVIDER_SITE_OTHER): Payer: Medicaid Other | Admitting: Orthopedic Surgery

## 2020-10-01 DIAGNOSIS — Z9889 Other specified postprocedural states: Secondary | ICD-10-CM

## 2020-10-01 NOTE — Progress Notes (Signed)
Post-Op Visit Note   Patient: Jose Dennis           Date of Birth: August 02, 2000           MRN: [id number] Visit Date: 10/01/2020 PCP: Pcp, No   Assessment & Plan:  Chief Complaint:  Chief Complaint  Patient presents with  . Left Knee - Routine Post Op    09/24/2020 left knee ACL reconstruction with quad autograft, left knee arthroscopy   Visit Diagnoses:  1. S/P ACL reconstruction   2. S/P medial meniscal repair     Plan: Patient is a 20 year old male who presents s/p left knee anterior cruciate ligament reconstruction with inside-out medial meniscal repair and lateral meniscectomy on 09/24/2020.  Patient reports that he is doing better and pain has finally gotten under control.  He was unable to sleep for the past several nights but he does report that last night he was able to get a full night's sleep.  He does note occasional fever and headache at night but this gets better after taking pain medication.  He has occasional chills but this is just at night.  No constitutional symptoms throughout the day.  He is compliant with his nonweightbearing status.  He has been using CPM machine and is up to 70 degrees.  On exam he has incisions that are healing well with sutures intact.  Sutures removed and replaced with Steri-Strips.  0 degrees of extension and about 45 degrees of knee flexion.  ACL graft is stable on Lachman exam.  Effusion is present.  No calf tenderness.  Negative Homans' sign.  Unable to perform straight leg raise at this time but his quad does seem to be firing.  Plan to continue using CPM machine and maintain nonweightbearing status for the next 2 weeks.  Follow-up in 2 weeks for clinical recheck with Dr. August Saucer and likely initiate physical therapy at that time.  Discussed the surgery that was done and explained what was going on in the procedure and answered patient's questions to his satisfaction.  Also provided prescription for shower chair to allow patient to shower while  putting weight on his leg.  Follow-up in 2 weeks.  Follow-Up Instructions: No follow-ups on file.   Orders:  No orders of the defined types were placed in this encounter.  No orders of the defined types were placed in this encounter.   Imaging: No results found.  PMFS History: Patient Active Problem List   Diagnosis Date Noted  . Acute lateral meniscus tear of left knee   . Acute medial meniscal tear, left, subsequent encounter   . Left ACL tear 06/09/2020  . Pain in left knee 04/29/2020  . Pain in left foot 07/07/2016   Past Medical History:  Diagnosis Date  . Medical history non-contributory     No family history on file.  Past Surgical History:  Procedure Laterality Date  . ANTERIOR CRUCIATE LIGAMENT REPAIR Left 09/24/2020   Procedure: LEFT KNEE ARTHROSCOPY, ACL RECONSTRUCTION WITH QUAD AUTOGRAFT;  Surgeon: Cammy Copa, MD;  Location: [facility] OR;  Service: Orthopedics;  Laterality: Left;  . NO PAST SURGERIES     Social History   Occupational History  . Not on file  Tobacco Use  . Smoking status: Never Smoker  . Smokeless tobacco: Never Used  Vaping Use  . Vaping Use: Some days  Substance and Sexual Activity  . Alcohol use: No  . Drug use: No  . Sexual activity: Not on file

## 2020-10-02 ENCOUNTER — Encounter: Payer: Medicaid Other | Admitting: Orthopaedic Surgery

## 2020-10-13 ENCOUNTER — Ambulatory Visit (HOSPITAL_COMMUNITY)
Admission: EM | Admit: 2020-10-13 | Discharge: 2020-10-13 | Disposition: A | Discharge status: Home / Self Care | Payer: Medicaid Other | Attending: Urgent Care | Admitting: Urgent Care

## 2020-10-13 ENCOUNTER — Other Ambulatory Visit: Payer: Self-pay

## 2020-10-13 ENCOUNTER — Encounter (HOSPITAL_COMMUNITY): Payer: Self-pay

## 2020-10-13 ENCOUNTER — Ambulatory Visit (INDEPENDENT_AMBULATORY_CARE_PROVIDER_SITE_OTHER): Payer: Medicaid Other

## 2020-10-13 DIAGNOSIS — R071 Chest pain on breathing: Secondary | ICD-10-CM

## 2020-10-13 DIAGNOSIS — R0781 Pleurodynia: Secondary | ICD-10-CM

## 2020-10-13 MED ORDER — NAPROXEN 500 MG PO TABS: 500.0000 mg | ORAL_TABLET | Freq: Two times a day (BID) | ORAL | 0 refills | Status: AC

## 2020-10-13 MED ORDER — TIZANIDINE HCL 4 MG PO TABS: 4.0000 mg | ORAL_TABLET | Freq: Three times a day (TID) | ORAL | 0 refills | Status: AC | PRN

## 2020-10-13 NOTE — ED Provider Notes (Signed)
[facility]   MRN: [id number] DOB: [birth year]  Subjective:   Jose Dennis is a 20 y.o. male presenting for 1 day history of acute onset moderate right lateral rib pain.  Patient also has pain with taking a deep breath.  No fall or trauma.  Patient has been on bedrest and believes he is sleeping on that side, thinks he might of slept wrong last night.  Denies bruising, swelling, bony deformity.  Has been using his regular pain medications.  Denies shortness of breath, history of clotting disorder.  No history of pneumothorax.  No current facility-administered medications for this encounter.  Current Outpatient Medications:    aspirin (ASPIRIN CHILDRENS) 81 MG chewable tablet, Chew 1 tablet (81 mg total) by mouth daily., Disp: 30 tablet, Rfl: 0   ketorolac (TORADOL) 10 MG tablet, Take 1 tablet (10 mg total) by mouth every 8 (eight) hours as needed., Disp: 15 tablet, Rfl: 0   methocarbamol (ROBAXIN) 500 MG tablet, Take 1 tablet (500 mg total) by mouth every 8 (eight) hours as needed., Disp: 30 tablet, Rfl: 0   oxyCODONE-acetaminophen (PERCOCET) 5-325 MG tablet, Take 1 tablet by mouth every 4 (four) hours as needed for severe pain., Disp: 30 tablet, Rfl: 0   No Known Allergies  Past Medical History:  Diagnosis Date   Medical history non-contributory      Past Surgical History:  Procedure Laterality Date   ANTERIOR CRUCIATE LIGAMENT REPAIR Left 09/24/2020   Procedure: LEFT KNEE ARTHROSCOPY, ACL RECONSTRUCTION WITH QUAD AUTOGRAFT;  Surgeon: Cammy Copa, MD;  Location: [facility] OR;  Service: Orthopedics;  Laterality: Left;   NO PAST SURGERIES      No family history on file.  Social History   Tobacco Use   Smoking status: Never Smoker   Smokeless tobacco: Never Used  Vaping Use   Vaping Use: Some days  Substance Use Topics   Alcohol use: No   Drug use: No    ROS   Objective:   Vitals: BP 118/83 (BP Location: Right Arm)   Pulse 95   Temp 98.2 F  (36.8 C) (Oral)   Resp 20   SpO2 100%   Physical Exam Constitutional:      General: He is not in acute distress.    Appearance: Normal appearance. He is well-developed. He is not ill-appearing, toxic-appearing or diaphoretic.  HENT:     Head: Normocephalic and atraumatic.     Right Ear: External ear normal.     Left Ear: External ear normal.     Nose: Nose normal.     Mouth/Throat:     Mouth: Mucous membranes are moist.     Pharynx: Oropharynx is clear.  Eyes:     General: No scleral icterus.    Extraocular Movements: Extraocular movements intact.     Pupils: Pupils are equal, round, and reactive to light.  Cardiovascular:     Rate and Rhythm: Normal rate and regular rhythm.     Heart sounds: Normal heart sounds. No murmur heard.   No friction rub. No gallop.  Pulmonary:     Effort: Pulmonary effort is normal. No respiratory distress.     Breath sounds: Normal breath sounds. No stridor. No wheezing, rhonchi or rales.  Chest:     Chest wall: Tenderness (right lateral lower ribs) present.  Neurological:     Mental Status: He is alert and oriented to person, place, and time.  Psychiatric:        Mood and  Affect: Mood normal.        Behavior: Behavior normal.        Thought Content: Thought content normal.        Judgment: Judgment normal.    DG Chest 2 View  Result Date: 10/13/2020 CLINICAL DATA:  Right-sided chest pain. Pain when taking deep breaths. Recent ACL repair. EXAM: CHEST - 2 VIEW COMPARISON:  Concurrent rib exam. FINDINGS: The cardiomediastinal contours are normal. The lungs are clear. Pulmonary vasculature is normal. No consolidation, pleural effusion, or pneumothorax. No acute osseous abnormalities are seen. IMPRESSION: Negative radiographs of the chest. Electronically Signed   By: Narda Rutherford M.D.   On: 10/13/2020 20:38   DG Ribs Unilateral Right  Result Date: 10/13/2020 CLINICAL DATA:  Right-sided rib pain. Pain when taking deep breaths. Recent ACL repair.  EXAM: RIGHT RIBS - 2 VIEW COMPARISON:  Concurrent chest radiographs. FINDINGS: No fracture or other bone lesions are seen involving the ribs. IMPRESSION: Negative radiographs of the right ribs. Electronically Signed   By: Narda Rutherford M.D.   On: 10/13/2020 20:38    Assessment and Plan :   PDMP not reviewed this encounter.  1. Rib pain on right side     X-rays negative, will manage for musculoskeletal rib and flank pain.  Recommended conservative management without narcotics. Counseled patient on potential for adverse effects with medications prescribed/recommended today, ER and return-to-clinic precautions discussed, patient verbalized understanding.    Wallis Bamberg, PA-C 10/16/20 1113

## 2020-10-13 NOTE — ED Triage Notes (Signed)
Pt presents with right side rib pain since yesterday non injury related; Pt has been on bedrest since having ACL surgery 2 weeks ago.

## 2020-10-15 ENCOUNTER — Encounter: Payer: Self-pay | Admitting: Orthopedic Surgery

## 2020-10-15 ENCOUNTER — Ambulatory Visit (INDEPENDENT_AMBULATORY_CARE_PROVIDER_SITE_OTHER): Payer: Medicaid Other | Admitting: Orthopedic Surgery

## 2020-10-15 ENCOUNTER — Other Ambulatory Visit: Payer: Self-pay

## 2020-10-15 ENCOUNTER — Emergency Department (HOSPITAL_COMMUNITY): Payer: Medicaid Other

## 2020-10-15 ENCOUNTER — Telehealth: Payer: Self-pay | Admitting: Radiology

## 2020-10-15 ENCOUNTER — Emergency Department (HOSPITAL_COMMUNITY)
Admission: EM | Admit: 2020-10-15 | Discharge: 2020-10-15 | Disposition: A | Discharge status: Home / Self Care | Payer: Medicaid Other | Attending: Emergency Medicine | Admitting: Emergency Medicine

## 2020-10-15 ENCOUNTER — Ambulatory Visit (HOSPITAL_COMMUNITY)
Admission: RE | Admit: 2020-10-15 | Discharge: 2020-10-15 | Disposition: A | Discharge status: Home / Self Care | Payer: Medicaid Other | Source: Ambulatory Visit | Attending: Orthopedic Surgery | Admitting: Orthopedic Surgery

## 2020-10-15 DIAGNOSIS — I2694 Multiple subsegmental pulmonary emboli without acute cor pulmonale: Secondary | ICD-10-CM | POA: Diagnosis not present

## 2020-10-15 DIAGNOSIS — Z9889 Other specified postprocedural states: Secondary | ICD-10-CM

## 2020-10-15 DIAGNOSIS — Z20822 Contact with and (suspected) exposure to covid-19: Secondary | ICD-10-CM | POA: Insufficient documentation

## 2020-10-15 DIAGNOSIS — I824Z2 Acute embolism and thrombosis of unspecified deep veins of left distal lower extremity: Secondary | ICD-10-CM | POA: Diagnosis not present

## 2020-10-15 DIAGNOSIS — Z7982 Long term (current) use of aspirin: Secondary | ICD-10-CM | POA: Diagnosis not present

## 2020-10-15 DIAGNOSIS — R079 Chest pain, unspecified: Secondary | ICD-10-CM

## 2020-10-15 DIAGNOSIS — R0781 Pleurodynia: Secondary | ICD-10-CM | POA: Diagnosis present

## 2020-10-15 LAB — TROPONIN I (HIGH SENSITIVITY)
Troponin I (High Sensitivity): <2 ng/L (ref <18)
Troponin I (High Sensitivity): <2 ng/L (ref <18)

## 2020-10-15 LAB — COMPREHENSIVE METABOLIC PANEL
ALT: 46 U/L — ABNORMAL HIGH (ref 0–44)
AST: 26 U/L (ref 15–41)
Albumin: 3.5 g/dL (ref 3.5–5.0)
Alkaline Phosphatase: 88 U/L (ref 38–126)
Anion gap: 9 (ref 5–15)
BUN: 11 mg/dL (ref 6–20)
CO2: 24 mmol/L (ref 22–32)
Calcium: 9.3 mg/dL (ref 8.9–10.3)
Chloride: 105 mmol/L (ref 98–111)
Creatinine, Ser: 0.64 mg/dL (ref 0.61–1.24)
GFR, Estimated: >60 mL/min (ref >60)
Glucose, Bld: 104 mg/dL — ABNORMAL HIGH (ref 70–99)
Potassium: 4.4 mmol/L (ref 3.5–5.1)
Sodium: 138 mmol/L (ref 135–145)
Total Bilirubin: 0.6 mg/dL (ref 0.3–1.2)
Total Protein: 7.4 g/dL (ref 6.5–8.1)

## 2020-10-15 LAB — CBC WITH DIFFERENTIAL/PLATELET
Abs Immature Granulocytes: 0.05 10*3/uL (ref 0.00–0.07)
Basophils Absolute: 0.1 10*3/uL (ref 0.0–0.1)
Basophils Relative: 1 %
Eosinophils Absolute: 0.1 10*3/uL (ref 0.0–0.5)
Eosinophils Relative: 2 %
HCT: 42.3 % (ref 39.0–52.0)
Hemoglobin: 14.2 g/dL (ref 13.0–17.0)
Immature Granulocytes: 1 %
Lymphocytes Relative: 21 %
Lymphs Abs: 1.8 10*3/uL (ref 0.7–4.0)
MCH: 30.3 pg (ref 26.0–34.0)
MCHC: 33.6 g/dL (ref 30.0–36.0)
MCV: 90.2 fL (ref 80.0–100.0)
Monocytes Absolute: 0.8 10*3/uL (ref 0.1–1.0)
Monocytes Relative: 9 %
Neutro Abs: 6 10*3/uL (ref 1.7–7.7)
Neutrophils Relative %: 66 %
Platelets: 391 10*3/uL (ref 150–400)
RBC: 4.69 MIL/uL (ref 4.22–5.81)
RDW: 11.7 % (ref 11.5–15.5)
WBC: 8.9 10*3/uL (ref 4.0–10.5)
nRBC: 0 % (ref 0.0–0.2)

## 2020-10-15 LAB — RESP PANEL BY RT-PCR (FLU A&B, COVID) ARPGX2
Influenza A by PCR: NEGATIVE
Influenza B by PCR: NEGATIVE
SARS Coronavirus 2 by RT PCR: NEGATIVE

## 2020-10-15 MED ORDER — IOHEXOL 350 MG/ML SOLN
50.0000 mL | Freq: Once | INTRAVENOUS | Status: AC | PRN
  Administered 2020-10-15: 50 mL via INTRAVENOUS

## 2020-10-15 MED ORDER — ELIQUIS 5 MG PO TABS: ORAL_TABLET | ORAL | 0 refills | Status: AC

## 2020-10-15 MED ORDER — APIXABAN 5 MG PO TABS: 10.0000 mg | ORAL_TABLET | Freq: Once | ORAL | Status: DC

## 2020-10-15 MED ORDER — RIVAROXABAN (XARELTO) VTE STARTER PACK (15 & 20 MG): ORAL_TABLET | ORAL | 0 refills | Status: AC

## 2020-10-15 MED ORDER — ENOXAPARIN SODIUM 100 MG/ML IJ SOSY
90.0000 mg | PREFILLED_SYRINGE | Freq: Two times a day (BID) | INTRAMUSCULAR | Status: DC
  Administered 2020-10-15: 90 mg via SUBCUTANEOUS
  Filled 2020-10-15 (×2): qty 0.9

## 2020-10-15 NOTE — Telephone Encounter (Signed)
FYI   I called and spoke with Verlon Au, triage at [facility] ED and advised Dr. August Saucer is sending patient straight over with concern of PE. Explained that patient is status post ACL reconstruction and had positive doppler today.

## 2020-10-15 NOTE — ED Provider Notes (Signed)
Care assumed with Dr. Rosalia Hammers.  At time of transfer of care, patient is awaiting for results of PE study and delta troponin.  Patient reportedly had a positive DVT ultrasound earlier today and was also having some chest discomfort.  Patient was already given Lovenox.  Per previous team plan, if there is not a large clot burden if there is PE discovered and the patient's vital signs are reassuring, he may be candidate for discharge home for outpatient anticoagulation therapy.  Delta troponin is also still in process, will reassess when that is completed.  Anticipate reassessment.  PE study shows he does have bilateral lower lobe pulmonary emboli without evidence of heart strain.  No other abnormality seen.  Went to reassess the patient and his vital signs are reassuring.  Patient will be discharged home.  I spoke with pharmacy who recommended he start Eliquis tomorrow and will provide him a first dose here to take tomorrow.  He will follow-up with a PCP for further management.  Patient due to plan of care and was discharged in good condition.  Clinical Impression: 1. DVT, lower extremity, distal, acute, left (HCC)   2. Multiple subsegmental pulmonary emboli without acute cor pulmonale (HCC)   3. Chest pain, unspecified type     Disposition: Discharge  Condition: Good  I have discussed the results, Dx and Tx plan with the pt(& family if present). He/she/they expressed understanding and agree(s) with the plan. Discharge instructions discussed at great length. Strict return precautions discussed and pt &/or family have verbalized understanding of the instructions. No further questions at time of discharge.    New Prescriptions   APIXABAN (ELIQUIS) 5 MG TABS TABLET    Take 2 tablets (10mg ) twice daily for 7 days, then 1 tablet (5mg ) twice daily   RIVAROXABAN (XARELTO) VTE STARTER PACK (15 & 20 MG)    Follow package directions: Take one 15mg  tablet by mouth twice a day. On day 22, switch to one 20mg   tablet once a day. Take with food.    Follow Up: [facility] [address] [place]  [phone] Schedule an appointment as soon as possible for a visit    [facility] EMERGENCY DEPARTMENT [address] [phone] mc [place] [facility] [address] [phone]       Treshaun Carrico, [place] ch, MD 10/15/20 [phone]

## 2020-10-15 NOTE — ED Provider Notes (Addendum)
Emergency Medicine Provider Triage Evaluation Note  Jose Dennis , Dennis 20 y.o. male  was evaluated in triage.  Pt complains of chest pain, pleuritic in nature.  Pain was worse yesterday however improved today. Pain sharp with deep breathing to right lower chest wall. Tenderness to left calf. Postop from ACL reconstruction on 09/24/20.  No hx of PE, DVT. Initially seen by urgent care on 10/13/20.  Had ultrasound today which is positive for DVT from femoral to popliteal.  Sent by orthopedic for concern for PE.  Review of Systems  Positive: Chest pain, lower extremity swelling, left calf pain Negative: SOB, syncope  Physical Exam  BP (!) 148/91 (BP Location: Right Arm)   Pulse 84   Temp 98.8 F (37.1 C) (Oral)   Resp 17   SpO2 98%  Gen:   Awake, no distress   Cardiac: 2+ DP pulses on left Resp:  Normal effort  MSK:   Moves extremities without difficulty.  Well-healed surgical incisions to left.  Diffuse tenderness to left calf Other:    Medical Decision Making  Medically screening exam initiated at 12:45 PM.  Appropriate orders placed.  Jose Dennis was informed that the remainder of the evaluation will be completed by another provider, this initial triage assessment does not replace that evaluation, and the importance of remaining in the ED until their evaluation is complete.  Pleuritic chest pain, [place] with DVT.  High clinical suspicion for PE.  Will order CTA chest, labs. NV intact.  Next for room in back       Jose Dennis, Jose Dino A, PA-C 10/15/20 1324    Wynetta Fines, MD 10/18/20 [phone]

## 2020-10-15 NOTE — Telephone Encounter (Signed)
Can't think of any other instructions other than discontinuing NSAIDs. Thanks for sending in the medication

## 2020-10-15 NOTE — Progress Notes (Signed)
   Post-Op Visit Note   Patient: Jose Dennis           Date of Birth: July 17, 2000           MRN: [id number] Visit Date: 10/15/2020 PCP: Pcp, No   Assessment & Plan:  Chief Complaint:  Chief Complaint  Patient presents with   Other    Post op 09/24/20 Lt knee ACL recon with inside out MMR and LM    Visit Diagnoses:  1. S/P ACL reconstruction     Plan: Mom is now 3 weeks out left knee ACL reconstruction with quad autograft as well as medial meniscal repair.  He has a CPM at 90 but today his range of motion is about 5-75.  I think too much pain medicine.  Can do about 1 straight leg raise.  Had a little bit of pleuritic chest pain on the right-hand side which resolved after 3 to 4 minutes.  Mild calf tenderness today.  Graft is stable.  No asymmetric swelling in the calf.  2-week return.  Okay to start weightbearing in 1 week.  Start physical therapy.  Primary goal is to get more flexion to 90.  Strengthening also secondary goal extension tertiary goal.  Ultrasound rule out DVT.  Therapy to start.  Ace wrap applied.  Follow-up in 2 weeks.  Follow-Up Instructions: Return in about 2 weeks (around 10/29/2020).   Orders:  Orders Placed This Encounter  Procedures   Ambulatory referral to Physical Therapy   VAS [place] LOWER EXTREMITY VENOUS (DVT)   No orders of the defined types were placed in this encounter.   Imaging: No results found.  PMFS History: Patient Active Problem List   Diagnosis Date Noted   Acute lateral meniscus tear of left knee    Acute medial meniscal tear, left, subsequent encounter    Left ACL tear 06/09/2020   Pain in left knee 04/29/2020   Pain in left foot 07/07/2016   Past Medical History:  Diagnosis Date   Medical history non-contributory     Family History  Family history unknown: Yes    Past Surgical History:  Procedure Laterality Date   ANTERIOR CRUCIATE LIGAMENT REPAIR Left 09/24/2020   Procedure: LEFT KNEE ARTHROSCOPY, ACL RECONSTRUCTION  WITH QUAD AUTOGRAFT;  Surgeon: Meredith Pel, MD;  Location: [facility];  Service: Orthopedics;  Laterality: Left;   NO PAST SURGERIES     Social History   Occupational History   Not on file  Tobacco Use   Smoking status: Never   Smokeless tobacco: Never  Vaping Use   Vaping Use: Some days  Substance and Sexual Activity   Alcohol use: No   Drug use: No   Sexual activity: Not on file

## 2020-10-15 NOTE — ED Notes (Signed)
Notified tech about PT BP

## 2020-10-15 NOTE — Discharge Instructions (Addendum)
Your history, exam, work-up today confirmed both blood clot in your leg as well as blood clot in your lungs called pulmonary embolism.  There was no evidence of heart strain and your heart enzymes were reassuring and negative both times we checked them.  After speaking with pharmacy, we recommend you start anticoagulation therapy with Eliquis and follow-up with your primary doctor.  Please continue this medication.  Please rest and stay hydrated.  If any symptoms change or worsen acutely, please return to the nearest emergency department.  Information on my medicine - ELIQUIS (apixaban)  This medication education was reviewed with me or my healthcare representative as part of my discharge preparation.  The pharmacist that spoke with me during my hospital stay was:  Sampson Si, [facility]  Why was Eliquis prescribed for you? Eliquis was prescribed to treat blood clots that may have been found in the veins of your legs (deep vein thrombosis) or in your lungs (pulmonary embolism) and to reduce the risk of them occurring again.  What do You need to know about Eliquis ? The starting dose is 10 mg (two 5 mg tablets) taken TWICE daily for the FIRST SEVEN (7) DAYS, then on (enter date)  10/23/2020  the dose is reduced to ONE 5 mg tablet taken TWICE daily.  Eliquis may be taken with or without food.   Try to take the dose about the same time in the morning and in the evening. If you have difficulty swallowing the tablet whole please discuss with your pharmacist how to take the medication safely.  Take Eliquis exactly as prescribed and DO NOT stop taking Eliquis without talking to the doctor who prescribed the medication.  Stopping may increase your risk of developing a new blood clot.  Refill your prescription before you run out.  After discharge, you should have regular check-up appointments with your healthcare provider that is prescribing your Eliquis.    What do you do if you miss a  dose? If a dose of ELIQUIS is not taken at the scheduled time, take it as soon as possible on the same day and twice-daily administration should be resumed. The dose should not be doubled to make up for a missed dose.  Important Safety Information A possible side effect of Eliquis is bleeding. You should call your healthcare provider right away if you experience any of the following: Bleeding from an injury or your nose that does not stop. Unusual colored urine (red or dark brown) or unusual colored stools (red or black). Unusual bruising for unknown reasons. A serious fall or if you hit your head (even if there is no bleeding).  Some medicines may interact with Eliquis and might increase your risk of bleeding or clotting while on Eliquis. To help avoid this, consult your healthcare provider or pharmacist prior to using any new prescription or non-prescription medications, including herbals, vitamins, non-steroidal anti-inflammatory drugs (NSAIDs) and supplements.  This website has more information on Eliquis (apixaban): http://www.eliquis.com/eliquis/home

## 2020-10-15 NOTE — Progress Notes (Signed)
ANTICOAGULATION CONSULT NOTE - Initial Consult  Pharmacy Consult for Enoxaparin Indication: pulmonary embolus and DVT  No Known Allergies  Patient Measurements:  5'11" 88.9kg  Vital Signs: Temp: 98.8 F (37.1 C) (06/09 1224) Temp Source: Oral (06/09 1224) BP: 108/87 (06/09 1330) Pulse Rate: 72 (06/09 1330)  Labs: Recent Labs    10/15/20 1325  HGB 14.2  HCT 42.3  PLT 391  CREATININE 0.64  TROPONINIHS <2    Estimated Creatinine Clearance: 158.2 mL/min (by C-G formula based on SCr of 0.64 mg/dL).   Medical History: Past Medical History:  Diagnosis Date   Medical history non-contributory     Medications:   Assessment: Patient presented with chest pain, pleuritic in nature.  Pain was worse yesterday however improved today. Pain sharp with deep breathing to right lower chest wall. Tenderness to left calf. Postop from ACL reconstruction on 09/24/20.  No hx of PE, DVT. HR and blood pressure within normal limits, now no shortness of breath.  Baseline renal function with CrCl >90, Hgb stable at 14.2, PLT stable at 391  Goal of Therapy:  Anti-Xa level 0.6-1 units/ml 4hrs after LMWH dose given Monitor platelets by anticoagulation protocol: Yes   Plan:  Lovenox 90mg  (1mg /kg) twice daily Follow CBCs Follow up plans for oral anticoagulation, lmwh level if continued  , PharmD PGY1 Pharmacy Resident 10/15/2020 2:05 PM

## 2020-10-15 NOTE — Telephone Encounter (Signed)
Looks like he is positive for PE based on scan in ER

## 2020-10-15 NOTE — ED Triage Notes (Signed)
Patient sent to ED for evaluation of possible DVT after surgery on left knee, ultrasound result in Epic. No dyspnea. Patient alert, oriented, and in no apparent distress at this time.

## 2020-10-15 NOTE — ED Provider Notes (Signed)
[facility]   CSN: [id number] Arrival date & time: 10/15/20  1218     History No chief complaint on file.   Jose CowerMohamed Slabaugh is a 20 y.o. male.  HPI    20 year old male presents today with right pleuritic chest pain, DVT, status post ACL reconstruction 519.  Patient with tenderness to left calf and positive DVT study today.  He reports that he had some chest pain 2 days ago on the right.  At that time he was seen at urgent care, he subsequently was sent for a DVT study that was reported as normal and told to come to the ED due to the chest pain.  Denies any current pain, shortness of breath.  He has some swelling in the left lower leg.  Past Medical History:  Diagnosis Date   Medical history non-contributory     Patient Active Problem List   Diagnosis Date Noted   Acute lateral meniscus tear of left knee    Acute medial meniscal tear, left, subsequent encounter    Left ACL tear 06/09/2020   Pain in left knee 04/29/2020   Pain in left foot 07/07/2016    Past Surgical History:  Procedure Laterality Date   ANTERIOR CRUCIATE LIGAMENT REPAIR Left 09/24/2020   Procedure: LEFT KNEE ARTHROSCOPY, ACL RECONSTRUCTION WITH QUAD AUTOGRAFT;  Surgeon: Cammy Copaean, Gregory Scott, MD;  Location: [facility];  Service: Orthopedics;  Laterality: Left;   NO PAST SURGERIES         Family History  Family history unknown: Yes    Social History   Tobacco Use   Smoking status: Never   Smokeless tobacco: Never  Vaping Use   Vaping Use: Some days  Substance Use Topics   Alcohol use: No   Drug use: No    Home Medications Prior to Admission medications   Medication Sig Start Date End Date Taking? Authorizing Provider  aspirin (ASPIRIN CHILDRENS) 81 MG chewable tablet Chew 1 tablet (81 mg total) by mouth daily. 09/24/20 10/24/20  Magnant, Charles L, PA-C  ketorolac (TORADOL) 10 MG tablet Take 1 tablet (10 mg total) by mouth every 8 (eight) hours  as needed. 09/24/20   Magnant, Charles L, PA-C  methocarbamol (ROBAXIN) 500 MG tablet Take 1 tablet (500 mg total) by mouth every 8 (eight) hours as needed. 09/24/20   Magnant, Charles L, PA-C  naproxen (NAPROSYN) 500 MG tablet Take 1 tablet (500 mg total) by mouth 2 (two) times daily with a meal. 10/13/20   Wallis BambergMani, Mario, PA-C  oxyCODONE-acetaminophen (PERCOCET) 5-325 MG tablet Take 1 tablet by mouth every 4 (four) hours as needed for severe pain. 09/24/20 09/24/21  Magnant, Joycie Peekharles L, PA-C  RIVAROXABAN (XARELTO) VTE STARTER PACK (15 & 20 MG) Follow package directions: Take one 15mg  tablet by mouth twice a day. On day 22, switch to one 20mg  tablet once a day. Take with food. 10/15/20   Cammy Copaean, Gregory Scott, MD  tiZANidine (ZANAFLEX) 4 MG tablet Take 1 tablet (4 mg total) by mouth every 8 (eight) hours as needed. 10/13/20   Wallis BambergMani, Mario, PA-C    Allergies    Patient has no known allergies.  Review of Systems   Review of Systems  All other systems reviewed and are negative.  Physical Exam Updated Vital Signs BP 108/87   Pulse 72   Temp 98.8 F (37.1 C) (Oral)   Resp (!) 22   SpO2 100%   Physical Exam Vitals and nursing note reviewed.  Constitutional:      Appearance: Normal appearance.  HENT:     Head: Normocephalic.     Right Ear: External ear normal.     Left Ear: External ear normal.     Nose: Nose normal.     Mouth/Throat:     Mouth: Mucous membranes are moist.  Eyes:     Pupils: Pupils are equal, round, and reactive to light.  Cardiovascular:     Rate and Rhythm: Normal rate.  Pulmonary:     Effort: Pulmonary effort is normal.  Abdominal:     General: Abdomen is flat.     Palpations: Abdomen is soft.  Musculoskeletal:        General: Swelling and tenderness present. Normal range of motion.     Cervical back: Normal range of motion.     Comments: Swelling and tenderness left lower extremity  Skin:    General: Skin is warm.     Capillary Refill: Capillary refill takes less than  2 seconds.  Neurological:     General: No focal deficit present.     Mental Status: He is alert. Mental status is at baseline.  Psychiatric:        Mood and Affect: Mood normal.    ED Results / Procedures / Treatments   Labs (all labs ordered are listed, but only abnormal results are displayed) Labs Reviewed  RESP PANEL BY RT-PCR (FLU A&B, COVID) ARPGX2  CBC WITH DIFFERENTIAL/PLATELET  COMPREHENSIVE METABOLIC PANEL  TROPONIN I (HIGH SENSITIVITY)    EKG None  Radiology DG Chest 2 View  Result Date: 10/13/2020 CLINICAL DATA:  Right-sided chest pain. Pain when taking deep breaths. Recent ACL repair. EXAM: CHEST - 2 VIEW COMPARISON:  Concurrent rib exam. FINDINGS: The cardiomediastinal contours are normal. The lungs are clear. Pulmonary vasculature is normal. No consolidation, pleural effusion, or pneumothorax. No acute osseous abnormalities are seen. IMPRESSION: Negative radiographs of the chest. Electronically Signed   By: Narda Rutherford M.D.   On: 10/13/2020 20:38   DG Ribs Unilateral Right  Result Date: 10/13/2020 CLINICAL DATA:  Right-sided rib pain. Pain when taking deep breaths. Recent ACL repair. EXAM: RIGHT RIBS - 2 VIEW COMPARISON:  Concurrent chest radiographs. FINDINGS: No fracture or other bone lesions are seen involving the ribs. IMPRESSION: Negative radiographs of the right ribs. Electronically Signed   By: Narda Rutherford M.D.   On: 10/13/2020 20:38   VAS [place] LOWER EXTREMITY VENOUS (DVT)  Result Date: 10/15/2020  Lower Venous DVT Study Patient Name:  Jose Dennis  Date of Exam:   10/15/2020 Medical Rec #: [id number]            Accession #:    [id number] Date of Birth: [birth year]            Patient Gender: M Patient Age:   19Y Exam Location:  [facility] Procedure:      VAS [place] LOWER EXTREMITY VENOUS (DVT) Referring Phys: [id number] Corrie Mckusick DEAN --------------------------------------------------------------------------------  Indications: Swelling left  lower extremity.  Risk Factors: Surgery May 19th. Performing Technologist: Elita Quick RVT  Examination Guidelines: A complete evaluation includes B-mode imaging, spectral Doppler, color Doppler, and power Doppler as needed of all accessible portions of each vessel. Bilateral testing is considered an integral part of a complete examination. Limited examinations for reoccurring indications may be performed as noted. The reflux portion of the exam is performed with the patient in reverse Trendelenburg.  +-----+---------------+---------+-----------+----------+--------------+ RIGHTCompressibilityPhasicitySpontaneityPropertiesThrombus Aging +-----+---------------+---------+-----------+----------+--------------+ CFV  Full  Yes      Yes                                 +-----+---------------+---------+-----------+----------+--------------+   +---------+---------------+---------+-----------+----------+--------------+ LEFT     CompressibilityPhasicitySpontaneityPropertiesThrombus Aging +---------+---------------+---------+-----------+----------+--------------+ CFV      Full           Yes      Yes                                 +---------+---------------+---------+-----------+----------+--------------+ SFJ      Full           Yes      Yes                                 +---------+---------------+---------+-----------+----------+--------------+ FV Prox  Full           Yes      Yes                                 +---------+---------------+---------+-----------+----------+--------------+ FV Mid   Full           Yes      Yes                                 +---------+---------------+---------+-----------+----------+--------------+ FV DistalNone           No       No                                  +---------+---------------+---------+-----------+----------+--------------+ PFV      Full           Yes      Yes                                  +---------+---------------+---------+-----------+----------+--------------+ POP      None           No       No                                  +---------+---------------+---------+-----------+----------+--------------+ PTV      None                    No                                  +---------+---------------+---------+-----------+----------+--------------+ PERO     None           No       No                                  +---------+---------------+---------+-----------+----------+--------------+ Gastroc  Full           Yes      Yes                                 +---------+---------------+---------+-----------+----------+--------------+  GSV      Full           Yes      Yes                                 +---------+---------------+---------+-----------+----------+--------------+ SSV      Full           Yes      Yes                                 +---------+---------------+---------+-----------+----------+--------------+   Findings reported to Dr. August Saucer at 12:05. Patient sent to the ER/  Summary: LEFT: - No compression and no color flow from the distal left femoral vein through the popliteal vein into the mid to distal calf veins suggestive of acute DVT.  *See table(s) above for measurements and observations. Electronically signed by Fabienne Bruns MD on 10/15/2020 at 1:18:45 PM.    Final     Procedures Procedures   Medications Ordered in ED Medications - No data to display  ED Course  I have reviewed the triage vital signs and the nursing notes.  Pertinent labs & imaging results that were available during my care of the patient were reviewed by me and considered in my medical decision making (see chart for details). Pharmacy consulted for Lovenox dosing while awaiting CTA   MDM Rules/Calculators/A&P                          CTA pending Discussed Dr. Rush Landmark He will evaluate patient after results of CTA and make appropriate disposition Final  Clinical Impression(s) / ED Diagnoses Final diagnoses:  DVT, lower extremity, distal, acute, left [facility])    Rx / DC Orders ED Discharge Orders     None        Margarita Grizzle, MD 10/15/20 [phone]

## 2020-10-21 ENCOUNTER — Telehealth: Payer: Self-pay

## 2020-10-21 NOTE — Telephone Encounter (Signed)
Patient's mom called.  Would like to know if patient is able to WB?   CB [phone]

## 2020-10-21 NOTE — Telephone Encounter (Signed)
IC no answer. Left detailed VM advising NON WEIGHT BEARING

## 2020-10-29 ENCOUNTER — Ambulatory Visit (INDEPENDENT_AMBULATORY_CARE_PROVIDER_SITE_OTHER): Payer: Medicaid Other | Admitting: Orthopedic Surgery

## 2020-10-29 DIAGNOSIS — Z9889 Other specified postprocedural states: Secondary | ICD-10-CM

## 2020-10-30 ENCOUNTER — Ambulatory Visit: Payer: Medicaid Other | Admitting: Physical Therapy

## 2020-10-31 ENCOUNTER — Encounter: Payer: Self-pay | Admitting: Orthopedic Surgery

## 2020-10-31 ENCOUNTER — Other Ambulatory Visit: Payer: Self-pay

## 2020-10-31 ENCOUNTER — Ambulatory Visit: Payer: Medicaid Other | Attending: Orthopedic Surgery | Admitting: Physical Therapy

## 2020-10-31 ENCOUNTER — Encounter: Payer: Self-pay | Admitting: Physical Therapy

## 2020-10-31 DIAGNOSIS — M6281 Muscle weakness (generalized): Secondary | ICD-10-CM | POA: Diagnosis present

## 2020-10-31 DIAGNOSIS — M25562 Pain in left knee: Secondary | ICD-10-CM

## 2020-10-31 DIAGNOSIS — R2689 Other abnormalities of gait and mobility: Secondary | ICD-10-CM | POA: Insufficient documentation

## 2020-10-31 NOTE — Patient Instructions (Signed)
Access Code: [email or web address] URL: [email or web address] Date: 10/31/2020 Prepared by: Alphonzo Severance  Exercises Supine Heel Slide with Strap - 3 x daily - 7 x weekly - 2 sets - 10 reps Supine Quad Set - 3 x daily - 7 x weekly - 2 sets - 10 reps - 10 hold Seated Passive Knee Extension - 3 x daily - 7 x weekly - 20 min hold

## 2020-10-31 NOTE — Therapy (Signed)
[facility] Outpatient Rehabilitation [facility] [address] [place], [place], [address] Phone: [phone]   Fax:  [phone]  Physical Therapy Evaluation  Patient Details  Name: Jose Dennis MRN: [id number] Date of Birth: 13-Dec-2000 Referring Provider (PT): August Saucer Corrie Mckusick, MD  Encounter Date: 10/31/2020   PT End of Session - 10/31/20 1209     Visit Number 1    Number of Visits 10    Date for PT Re-Evaluation 12/26/20    Authorization Type U[employer] MCD    Authorization - Visit Number --   same as visit number   Authorization - Number of Visits 27    PT Start Time 1030    PT Stop Time 1110    PT Time Calculation (min) 40 min    Activity Tolerance Patient tolerated treatment well    Behavior During Therapy [facility] for tasks assessed/performed              Past Medical History:  Diagnosis Date   Medical history non-contributory     Past Surgical History:  Procedure Laterality Date   ANTERIOR CRUCIATE LIGAMENT REPAIR Left 09/24/2020   Procedure: LEFT KNEE ARTHROSCOPY, ACL RECONSTRUCTION WITH QUAD AUTOGRAFT;  Surgeon: Cammy Copa, MD;  Location: [facility] OR;  Service: Orthopedics;  Laterality: Left;   NO PAST SURGERIES      There were no vitals filed for this visit.    Subjective Assessment - 10/31/20 1113     Subjective Jose Dennis is a 20 y.o. male who presents to clinic s/p left knee ACL reconstruction with inside-out medial meniscal repair and partial lateral meniscectomy on 5/19 after injury while playing soccer.  He has been working on firing his quads at home and using the CPM machine.  MOI/History of condition: FROM EMR:  "Patient presents now about 1 month out left knee ACL reconstruction with inside-out medial meniscal repair and partial lateral meniscectomy.  Has not started therapy yet.  Therapy has tried to call him once or twice but without success.  We gave them the number to call therapy to get it set up.  His postop course  has been complicated by DVT and pulmonary embolism.  He was started on Xarelto by [place] prior to his ER appointment and they added Eliquis.  That was discontinued.  Currently only on Eliquis.  On exam he has range of motion 0-96.  I am going to have him weight-bear as tolerated transitioning from 2 crutches to no crutches over a week time.  Continue with range of motion exercises and no loading of the knee with weight beyond 90 degrees.  Follow-up in 4 weeks for clinical recheck.  No calf tenderness negative Homans today.  No difficulty breathing today."  Pain location: diffuse L knee pain, medial.  Red flags: none.  48 hour pain intensity:  highest 4-5/10, current 0/10, best 0/10.  Aggs: moving around while sleeping, movement after sitting for long periods.  Eases: rest.  Nature: dull  Severity: low/mod  Irritability: low    Stage: sub acute - surgery.  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  Hobbies: soccer, basketbal.  Functional limitations/goals: get back to sport.  Home environment: lives at home for summer - going back to school late July.  2 STE, 14 steps between floors.  Assistive device: crutches.    Pertinent History History of PE, blood thinners                [facility] PT  Assessment - 10/31/20 0001       Assessment   Medical Diagnosis S/P ACL reconstruction (B55.974)    Referring Provider (PT) August Saucer Corrie Mckusick, MD    Onset Date/Surgical Date 09/24/20    Hand Dominance Right    Next MD Visit ~7/25    Prior Therapy none      Precautions   Precaution Comments history of PE      Restrictions   Other Position/Activity Restrictions W/B as tolerated with no crutches by 6/29, per MD (HE NEEDS TO REGAIN QUAD CONTROL AND EXT FIRST)      Balance Screen   Has the patient fallen in the past 6 months Yes    How many times? 1    Has the patient had a decrease in activity level because of a fear of falling?  No    Is the patient reluctant  to leave their home because of a fear of falling?  No      Functional Tests   Functional tests Other      Other:   Other/ Comments unable to perform SLR      ROM / Strength   AROM / PROM / Strength PROM;Strength      PROM   Overall PROM Comments R knee WNL, L knee 7-81      Strength   Overall Strength Comments R knee WNL, L knee significant quad activation deficit      Flexibility   Soft Tissue Assessment /Muscle Length yes    Hamstrings WFL      Ambulation/Gait   Gait Comments no knee ext brace, 2 crutches, PWB R LE                        Objective measurements completed on examination: See above findings.               PT Education - 10/31/20 1204     Education Details HEP, POC, precautions, prognosis    Person(s) Educated Patient    Methods Explanation;Demonstration;Handout    Comprehension Verbalized understanding;Returned demonstration              PT Short Term Goals - 10/31/20 1207       PT SHORT TERM GOAL #1   Title Jose Dennis will be >75% HEP compliant within 3 weeks to improve carryover between sessions and facilitate independent management of condition.    Target Date 11/21/20      PT SHORT TERM GOAL #2   Title Jose Dennis will be compliant with post op precautions throughout therapy.               PT Long Term Goals - 10/31/20 1208       PT LONG TERM GOAL #1   Title Jose Dennis will be able to stand for >30'' in SLS stance, to show a significant improvement in balance in order to reduce fall risk  EVAL: unable      PT LONG TERM GOAL #2   Title Jose Dennis will be able to perform squat to 90 degrees, not limited by pain  EVAL: not able    Target Date 12/26/20      PT LONG TERM GOAL #3   Title Jose Dennis will achieve 0-120 degrees knee ROM   EVAL: 7-81 degrees    Target Date 12/26/20                    Plan - 10/31/20 1206  Clinical Impression  Statement Jose Dennis is a 20 y.o. male who presents to clinic with signs and sxs consistent with ACL and meniscus repair on 5/19.  He is somewhat behind schedule with ROM and quad activation.  I would not reccomend d/c of crutches until he has good quad control and full ext.  He was not given a brace after surgery.  Pt presents with pain and notable deficits in: quad activation, balance, precautions, ROM.  Pt is limited functionally in walking, playing soccer, stair navigation, community participation.  Pt will benefit from skilled therapy to address pain and the listed deficits in order to achieve functional goals, enable safety and independence in completion of daily tasks, and return to PLOF.    Examination-Activity Limitations Squat;Transfers;Stairs;Lift;Carry    Examination-Participation Restrictions Community Activity    Stability/Clinical Decision Making Stable/Uncomplicated    Clinical Decision Making Low    Rehab Potential Excellent    PT Frequency 2x / week    PT Duration 8 weeks    PT Treatment/Interventions ADLs/Self Care Home Management;Aquatic Therapy;Gait training;Therapeutic activities;Therapeutic exercise;Neuromuscular re-education;Manual techniques;Dry needling    PT Next Visit Plan I would not reccomend d/c of crutches until he has good quad control and full ext - focus should be on this    PT Home Exercise Plan [email or web address]    Consulted and Agree with Plan of Care Patient             Patient will benefit from skilled therapeutic intervention in order to improve the following deficits and impairments:  Abnormal gait, Decreased endurance, Decreased strength, Pain, Decreased balance, Decreased mobility  Visit Diagnosis: Acute pain of left knee  Balance problem  Muscle weakness  Other abnormalities of gait and mobility     Problem List Patient Active Problem List   Diagnosis Date Noted   Acute lateral meniscus tear of left knee    Acute medial meniscal tear,  left, subsequent encounter    Left ACL tear 06/09/2020   Pain in left knee 04/29/2020   Pain in left foot 07/07/2016    Alphonzo Severance PT, DPT 10/31/20 12:25 PM  [facility] Outpatient Rehabilitation [facility] [address] [place], [place], [address] Phone: [phone]   Fax:  [phone]  Name: Jose Dennis MRN: [id number] Date of Birth: 30-Jan-2001

## 2020-10-31 NOTE — Progress Notes (Signed)
   Post-Op Visit Note   Patient: Jose Dennis           Date of Birth: 11-13-2000           MRN: [id number] Visit Date: 10/29/2020 PCP: Pcp, No   Assessment & Plan:  Chief Complaint:  Chief Complaint  Patient presents with   Left Knee - Routine Post Op   Visit Diagnoses:  1. S/P ACL reconstruction     Plan: Patient presents now about 1 month out left knee ACL reconstruction with inside-out medial meniscal repair and partial lateral meniscectomy.  Has not started therapy yet.  Therapy has tried to call him once or twice but without success.  We gave them the number to call therapy to get it set up.  His postop course has been complicated by DVT and pulmonary embolism.  He was started on Xarelto by [place] prior to his ER appointment and they added Eliquis.  That was discontinued.  Currently only on Eliquis.  On exam he has range of motion 0-96.  I am going to have him weight-bear as tolerated transitioning from 2 crutches to no crutches over a week time.  Continue with range of motion exercises and no loading of the knee with weight beyond 90 degrees.  Follow-up in 4 weeks for clinical recheck.  No calf tenderness negative Homans today.  No difficulty breathing today.  Follow-Up Instructions: No follow-ups on file.   Orders:  No orders of the defined types were placed in this encounter.  No orders of the defined types were placed in this encounter.   Imaging: No results found.  PMFS History: Patient Active Problem List   Diagnosis Date Noted   Acute lateral meniscus tear of left knee    Acute medial meniscal tear, left, subsequent encounter    Left ACL tear 06/09/2020   Pain in left knee 04/29/2020   Pain in left foot 07/07/2016   Past Medical History:  Diagnosis Date   Medical history non-contributory     Family History  Family history unknown: Yes    Past Surgical History:  Procedure Laterality Date   ANTERIOR CRUCIATE LIGAMENT REPAIR Left 09/24/2020   Procedure:  LEFT KNEE ARTHROSCOPY, ACL RECONSTRUCTION WITH QUAD AUTOGRAFT;  Surgeon: Cammy Copa, MD;  Location: [facility] OR;  Service: Orthopedics;  Laterality: Left;   NO PAST SURGERIES     Social History   Occupational History   Not on file  Tobacco Use   Smoking status: Never   Smokeless tobacco: Never  Vaping Use   Vaping Use: Some days  Substance and Sexual Activity   Alcohol use: No   Drug use: No   Sexual activity: Not on file

## 2020-11-02 ENCOUNTER — Encounter: Payer: Self-pay | Admitting: Physical Therapy

## 2020-11-02 ENCOUNTER — Ambulatory Visit: Payer: Medicaid Other | Admitting: Physical Therapy

## 2020-11-02 ENCOUNTER — Other Ambulatory Visit: Payer: Self-pay

## 2020-11-02 ENCOUNTER — Encounter: Payer: Medicaid Other | Admitting: Orthopedic Surgery

## 2020-11-02 DIAGNOSIS — R2689 Other abnormalities of gait and mobility: Secondary | ICD-10-CM

## 2020-11-02 DIAGNOSIS — M6281 Muscle weakness (generalized): Secondary | ICD-10-CM

## 2020-11-02 DIAGNOSIS — M25562 Pain in left knee: Secondary | ICD-10-CM

## 2020-11-02 NOTE — Patient Instructions (Signed)
Access Code: [email or web address] URL: [email or web address] Date: 11/02/2020 Prepared by: Myrla Halsted  New Exercises  Seated Knee Flexion Stretch - 1 x daily - 7 x weekly - 3 sets - 10 reps Mini Squat - 1 x daily - 7 x weekly - 3 sets - 10 reps Active Straight Leg Raise with Quad Set - 1 x daily - 7 x weekly - 3 sets - 10 reps

## 2020-11-02 NOTE — Therapy (Signed)
[facility] Outpatient Rehabilitation [facility] [address] [place], [place], [address] Phone: [phone]   Fax:  [phone]  Physical Therapy Treatment  Patient Details  Name: Jose Dennis MRN: [id number] Date of Birth: 06-21-2000 Referring Provider (PT): August Saucer Corrie Mckusick, MD   Encounter Date: 11/02/2020   PT End of Session - 11/02/20 1621     Visit Number 2    Number of Visits 10    Date for PT Re-Evaluation 12/26/20    Authorization Type UHC MCD    Authorization - Number of Visits 27    PT Start Time 1620    PT Stop Time 1701    PT Time Calculation (min) 41 min    Activity Tolerance Patient tolerated treatment well;No increased pain    Behavior During Therapy [facility] for tasks assessed/performed             Past Medical History:  Diagnosis Date   Medical history non-contributory     Past Surgical History:  Procedure Laterality Date   ANTERIOR CRUCIATE LIGAMENT REPAIR Left 09/24/2020   Procedure: LEFT KNEE ARTHROSCOPY, ACL RECONSTRUCTION WITH QUAD AUTOGRAFT;  Surgeon: Cammy Copa, MD;  Location: [facility] OR;  Service: Orthopedics;  Laterality: Left;   NO PAST SURGERIES      There were no vitals filed for this visit.   Subjective Assessment - 11/02/20 1624     Subjective Patient reports difficulty with prolonged knee ext stretch, but it's improving. Patient reports he's been working on quad activation.    Pertinent History History of PE, blood thinners    Currently in Pain? Yes    Pain Score 1     Pain Orientation Left    Pain Descriptors / Indicators Sore    Pain Type Surgical pain                [facility] PT Assessment - 11/02/20 0001       Assessment   Medical Diagnosis S/P ACL reconstruction (Y07.371)    Referring Provider (PT) Cammy Copa, MD    Onset Date/Surgical Date 09/24/20    Next MD Visit ~7/25      Precautions   Precaution Comments history of PE      Restrictions   Other Position/Activity  Restrictions W/B as tolerated with no crutches by 6/29, per MD (HE NEEDS TO REGAIN QUAD CONTROL AND EXT FIRST)                           [facility] Adult PT Treatment/Exercise - 11/02/20 0001       Knee/Hip Exercises: Stretches   Knee: Self-Stretch to increase Flexion 20 seconds;2 reps    Knee: Self-Stretch Limitations Seated scooting to flex      Knee/Hip Exercises: Aerobic   Nustep L4x58min      Knee/Hip Exercises: Standing   Functional Squat 10 reps;2 sets    Functional Squat Limitations UE support    Gait Training Lateral stepping along counter 8'x4 ea dir, FWD with R only UE support, then with one crutch x8', 4x40'    Other Standing Knee Exercises Standing weight shift, progressing to weight shift onto L with R heel lift.      Knee/Hip Exercises: Supine   Quad Sets 10 reps    Quad Sets Limitations Slow contraction and release    Straight Leg Raises 5 reps    Straight Leg Raises Limitations Initiate with quad set and completely relax between reps  PT Education - 11/02/20 1805     Education Details Additions to HEP for quad strength and stretch.  Utilize one crutch only for short household mobility.    Person(s) Educated Patient    Methods Explanation;Demonstration;Handout    Comprehension Verbalized understanding;Returned demonstration              PT Short Term Goals - 10/31/20 1207       PT SHORT TERM GOAL #1   Title [name] will be >75% HEP compliant within 3 weeks to improve carryover between sessions and facilitate independent management of condition.    Target Date 11/21/20      PT SHORT TERM GOAL #2   Title [name] will be compliant with post op precautions throughout therapy.               PT Long Term Goals - 10/31/20 1208       PT LONG TERM GOAL #1   Title [name] will be able to stand for >30'' in SLS stance, to show a significant improvement in balance in order to  reduce fall risk  EVAL: unable      PT LONG TERM GOAL #2   Title Jhoan Schmieder will be able to perform squat to 90 degrees, not limited by pain  EVAL: not able    Target Date 12/26/20      PT LONG TERM GOAL #3   Title [name] will achieve 0-120 degrees knee ROM   EVAL: 7-81 degrees    Target Date 12/26/20                   Plan - 11/02/20 1806     Clinical Impression Statement Patient now able to active quad enough to perform SLR with small lag and extra effort to initiate with quad set.  Patient able to amblate slowly with single crutch and symmetric pattern. L knee ROM improved to -3 to 85deg. Patient will benefit from skilled PT to address deficits and return to active lifestyle with stability and less pain.    Examination-Activity Limitations Squat;Transfers;Stairs;Lift;Carry    Examination-Participation Restrictions Community Activity    PT Treatment/Interventions ADLs/Self Care Home Management;Aquatic Therapy;Gait training;Therapeutic activities;Therapeutic exercise;Neuromuscular re-education;Manual techniques;Dry needling    PT Next Visit Plan Continue with quad strengthening and ext/flex ROM.  Wean to single crutch, but will need at least one crutch until further quad control and full extension.    PT Home Exercise Plan [email or web address]    Consulted and Agree with Plan of Care Patient             Patient will benefit from skilled therapeutic intervention in order to improve the following deficits and impairments:  Abnormal gait, Decreased endurance, Decreased strength, Pain, Decreased balance, Decreased mobility  Visit Diagnosis: Acute pain of left knee  Balance problem  Muscle weakness  Other abnormalities of gait and mobility     Problem List Patient Active Problem List   Diagnosis Date Noted   Acute lateral meniscus tear of left knee    Acute medial meniscal tear, left, subsequent encounter    Left ACL tear 06/09/2020   Pain in left  knee 04/29/2020   Pain in left foot 07/07/2016    Myrla Halsted, PT 11/02/2020, 6:11 PM  [facility] Outpatient Rehabilitation [facility] [address] [place], [place], [address] Phone: [phone]   Fax:  [phone]  Name: Conlin Brahm MRN: [id number] Date of Birth: [birth year]

## 2020-11-04 ENCOUNTER — Other Ambulatory Visit: Payer: Self-pay

## 2020-11-04 ENCOUNTER — Ambulatory Visit: Payer: Medicaid Other | Admitting: Physical Therapy

## 2020-11-04 ENCOUNTER — Encounter: Payer: Self-pay | Admitting: Physical Therapy

## 2020-11-04 DIAGNOSIS — M6281 Muscle weakness (generalized): Secondary | ICD-10-CM

## 2020-11-04 DIAGNOSIS — R2689 Other abnormalities of gait and mobility: Secondary | ICD-10-CM

## 2020-11-04 DIAGNOSIS — M25562 Pain in left knee: Secondary | ICD-10-CM | POA: Diagnosis not present

## 2020-11-04 NOTE — Patient Instructions (Signed)
Access Code: [email or web address] URL: [email or web address] Date: 11/04/2020 Prepared by: Myrla Halsted  New Exercises  Sidelying toe taps for glute med strength - 1 x daily - 7 x weekly - 3 sets - 10 reps

## 2020-11-04 NOTE — Therapy (Signed)
[facility] Outpatient Rehabilitation [facility] [address] [place], [place], [address] Phone: [phone]   Fax:  [phone]  Physical Therapy Treatment  Patient Details  Name: Jose Dennis MRN: [id number] Date of Birth: [birth year] Referring Provider (PT): Jose Saucer Corrie Mckusick, MD   Encounter Date: 11/04/2020   PT End of Session - 11/04/20 1916     Visit Number 3    Number of Visits 10    Date for PT Re-Evaluation 12/26/20    Authorization Type UHC MCD    Authorization - Number of Visits 27    PT Start Time 1835    PT Stop Time 1918    PT Time Calculation (min) 43 min    Activity Tolerance Patient tolerated treatment well;No increased pain    Behavior During Therapy [facility] for tasks assessed/performed             Past Medical History:  Diagnosis Date   Medical history non-contributory     Past Surgical History:  Procedure Laterality Date   ANTERIOR CRUCIATE LIGAMENT REPAIR Left 09/24/2020   Procedure: LEFT KNEE ARTHROSCOPY, ACL RECONSTRUCTION WITH QUAD AUTOGRAFT;  Surgeon: Cammy Copa, MD;  Location: [facility] OR;  Service: Orthopedics;  Laterality: Left;   NO PAST SURGERIES      There were no vitals filed for this visit.   Subjective Assessment - 11/04/20 1835     Subjective Patient reports good HEP compliance, no problems    Pertinent History History of PE, blood thinners    Currently in Pain? Yes    Pain Score 0-No pain    Pain Location Knee    Pain Orientation Left    Pain Descriptors / Indicators Tightness    Pain Type Acute pain;Surgical pain                [facility] PT Assessment - 11/04/20 0001       Assessment   Medical Diagnosis S/P ACL reconstruction (D17.616)    Referring Provider (PT) Cammy Copa, MD    Onset Date/Surgical Date 09/24/20    Next MD Visit ~7/25      Precautions   Precaution Comments history of PE      Restrictions   Other Position/Activity Restrictions W/B as tolerated with no crutches  by 6/29, per MD (HE NEEDS TO REGAIN QUAD CONTROL AND EXT FIRST)                           [facility] Adult PT Treatment/Exercise - 11/04/20 0001       Knee/Hip Exercises: Standing   Heel Raises 20 reps;2 sets    Functional Squat 10 reps;2 sets    Functional Squat Limitations UE support                    PT Education - 11/04/20 1908     Education Details Addition to HEP for sidelying hip abd.    Person(s) Educated Patient    Methods Explanation;Demonstration;Handout    Comprehension Verbalized understanding;Returned demonstration              PT Short Term Goals - 10/31/20 1207       PT SHORT TERM GOAL #1   Title Jose Dennis will be >75% HEP compliant within 3 weeks to improve carryover between sessions and facilitate independent management of condition.    Target Date 11/21/20      PT SHORT TERM GOAL #2   Title Jose Dennis will be  compliant with post op precautions throughout therapy.               PT Long Term Goals - 10/31/20 1208       PT LONG TERM GOAL #1   Title Jose Dennis will be able to stand for >30'' in SLS stance, to show a significant improvement in balance in order to reduce fall risk  EVAL: unable      PT LONG TERM GOAL #2   Title Jose Dennis will be able to perform squat to 90 degrees, not limited by pain  EVAL: not able    Target Date 12/26/20      PT LONG TERM GOAL #3   Title Jose Dennis will achieve 0-120 degrees knee ROM   EVAL: 7-81 degrees    Target Date 12/26/20                   Plan - 11/04/20 1919     Clinical Impression Statement Patient with improved gait with single crutch, advised to only perform in the household, continue to use 2 crutchesin the community until improved quad stability.  Patient with decreased lag during SLR. Patient will benefit from skilled PT to address deficits and return to active lifestyle with stability and less pain.     Examination-Activity Limitations Squat;Transfers;Stairs;Lift;Carry    Examination-Participation Restrictions Community Activity    PT Treatment/Interventions ADLs/Self Care Home Management;Aquatic Therapy;Gait training;Therapeutic activities;Therapeutic exercise;Neuromuscular re-education;Manual techniques;Dry needling    PT Next Visit Plan Continue with quad strengthening and ext/flex ROM.  Wean to single crutch, but will need at least one crutch until further quad control and full extension.    PT Home Exercise Plan [email or web address]    Consulted and Agree with Plan of Care Patient             Patient will benefit from skilled therapeutic intervention in order to improve the following deficits and impairments:  Abnormal gait, Decreased endurance, Decreased strength, Pain, Decreased balance, Decreased mobility  Visit Diagnosis: Acute pain of left knee  Balance problem  Muscle weakness  Other abnormalities of gait and mobility     Problem List Patient Active Problem List   Diagnosis Date Noted   Acute lateral meniscus tear of left knee    Acute medial meniscal tear, left, subsequent encounter    Left ACL tear 06/09/2020   Pain in left knee 04/29/2020   Pain in left foot 07/07/2016    Jose Dennis, PT 11/04/2020, 7:26 PM  [facility] Outpatient Rehabilitation [facility] [address] [place], [place], [address] Phone: [phone]   Fax:  [phone]  Name: Jose Dennis MRN: [id number] Date of Birth: September 02, 2000

## 2020-11-11 ENCOUNTER — Ambulatory Visit: Payer: Medicaid Other | Attending: Orthopedic Surgery | Admitting: Physical Therapy

## 2020-11-11 ENCOUNTER — Encounter: Payer: Self-pay | Admitting: Physical Therapy

## 2020-11-11 ENCOUNTER — Other Ambulatory Visit: Payer: Self-pay

## 2020-11-11 DIAGNOSIS — M25562 Pain in left knee: Secondary | ICD-10-CM | POA: Diagnosis not present

## 2020-11-11 DIAGNOSIS — R2689 Other abnormalities of gait and mobility: Secondary | ICD-10-CM | POA: Diagnosis present

## 2020-11-11 DIAGNOSIS — M6281 Muscle weakness (generalized): Secondary | ICD-10-CM

## 2020-11-11 NOTE — Therapy (Signed)
[facility] Outpatient Rehabilitation [facility] [address] [place], [place], [address] Phone: [phone]   Fax:  [phone]  Physical Therapy Treatment  Patient Details  Name: Jose Dennis MRN: [id number] Date of Birth: 03-12-2001 Referring Provider (PT): August Saucer Corrie Mckusick, MD   Encounter Date: 11/11/2020   PT End of Session - 11/11/20 1836     Visit Number 4    Number of Visits 10    Date for PT Re-Evaluation 12/26/20    Authorization Type UHC MCD    Authorization - Number of Visits 27    PT Start Time 1833    PT Stop Time 1921    PT Time Calculation (min) 48 min    Activity Tolerance Patient tolerated treatment well;No increased pain    Behavior During Therapy [facility] for tasks assessed/performed             Past Medical History:  Diagnosis Date   Medical history non-contributory     Past Surgical History:  Procedure Laterality Date   ANTERIOR CRUCIATE LIGAMENT REPAIR Left 09/24/2020   Procedure: LEFT KNEE ARTHROSCOPY, ACL RECONSTRUCTION WITH QUAD AUTOGRAFT;  Surgeon: Cammy Copa, MD;  Location: [facility] OR;  Service: Orthopedics;  Laterality: Left;   NO PAST SURGERIES      There were no vitals filed for this visit.   Subjective Assessment - 11/11/20 1841     Subjective Patient states he's been doing good, has even taken some steps without the crutch, mostly using 1 crutch.  Patient reports good compliance with HEP.    Pertinent History History of PE, blood thinners    Currently in Pain? Yes    Pain Score 0-No pain    Pain Location Knee    Pain Orientation Left                [facility] PT Assessment - 11/11/20 0001       Assessment   Medical Diagnosis S/P ACL reconstruction (O03.212)    Referring Provider (PT) August Saucer Corrie Mckusick, MD    Onset Date/Surgical Date 09/24/20    Next MD Visit ~7/25      Precautions   Precaution Comments history of PE      Restrictions   Other Position/Activity Restrictions W/B as tolerated  with no crutches by 6/29, per MD (HE NEEDS TO REGAIN QUAD CONTROL AND EXT FIRST)                           [facility] Adult PT Treatment/Exercise - 11/11/20 0001       Ambulation/Gait   Ambulation/Gait Yes    Gait Comments Pregait with L step over and back 1" high object; followed by improved foot clearance ambulating without crutch.      Knee/Hip Exercises: Aerobic   Recumbent Bike initiated with rocking, able to make full      Knee/Hip Exercises: Machines for Strengthening   Cybex Leg Press 25# 2x5reps L LE    Other Machine 4WAY hip 37.5# 2x10ea dir      Knee/Hip Exercises: Standing   Terminal Knee Extension 15 reps;2 sets    Terminal Knee Extension Limitations BLUE    Functional Squat 10 reps;2 sets    Functional Squat Limitations Elevated mat tap    Rocker Board 1 minute    Rocker Board Limitations R/L and FWD/BK      Knee/Hip Exercises: Supine   Bridges 10 reps    Bridges with [employer] 10 reps  Bridges with Clamshell 10 reps   GREEN   Straight Leg Raises 15 reps    Straight Leg Raises Limitations Initiate with quad set and completely relax between reps                    PT Education - 11/11/20 1936     Education Details Addition of squat to chair touch on elevated surface to HEP.    Person(s) Educated Patient    Methods Explanation;Demonstration;Verbal cues    Comprehension Returned demonstration              PT Short Term Goals - 11/11/20 1941       PT SHORT TERM GOAL #1   Title Jose Dennis will be >75% HEP compliant within 3 weeks to improve carryover between sessions and facilitate independent management of condition.    Status Achieved      PT SHORT TERM GOAL #2   Title Jose Dennis will be compliant with post op precautions throughout therapy.    Status Achieved               PT Long Term Goals - 10/31/20 1208       PT LONG TERM GOAL #1   Title Jose Dennis will be able to stand for  >30'' in SLS stance, to show a significant improvement in balance in order to reduce fall risk  EVAL: unable      PT LONG TERM GOAL #2   Title Jose Dennis will be able to perform squat to 90 degrees, not limited by pain  EVAL: not able    Target Date 12/26/20      PT LONG TERM GOAL #3   Title Jose Dennis will achieve 0-120 degrees knee ROM   EVAL: 7-81 degrees    Target Date 12/26/20                   Plan - 11/11/20 1937     Clinical Impression Statement Patient has made excellent progress, now able to ambulate without a crutch, recommended patient keep crutch by bed in case first thing in the morning he needed extra support.  Patient ROM now 0deg - 95deg. Patient able to perform increased knee and hip strengthening without incrased pain, challenged by single leg press at 25#, but remained controlled concentric and eccentric.  Patient will benefit from continued skilled PT to address deficits and maximize return to active lifestyle including sports.    Examination-Activity Limitations Squat;Transfers;Stairs;Lift;Carry    Examination-Participation Restrictions Community Activity    PT Treatment/Interventions ADLs/Self Care Home Management;Aquatic Therapy;Gait training;Therapeutic activities;Therapeutic exercise;Neuromuscular re-education;Manual techniques;Dry needling    PT Next Visit Plan Continue with quad strengthening and flex ROM.  Hip strength and knee stability, balance and muscle endurance.    PT Home Exercise Plan [email or web address]    Consulted and Agree with Plan of Care Patient             Patient will benefit from skilled therapeutic intervention in order to improve the following deficits and impairments:  Abnormal gait, Decreased endurance, Decreased strength, Pain, Decreased balance, Decreased mobility  Visit Diagnosis: Acute pain of left knee  Balance problem  Muscle weakness  Other abnormalities of gait and mobility     Problem  List Patient Active Problem List   Diagnosis Date Noted   Acute lateral meniscus tear of left knee    Acute medial meniscal tear, left, subsequent encounter    Left ACL tear 06/09/2020   Pain  in left knee 04/29/2020   Pain in left foot 07/07/2016    Myrla Halsted, PT 11/11/2020, 7:44 PM  [facility] [address] [place], [place], [address] Phone: [phone]   Fax:  [phone]  Name: Jose Dennis MRN: [id number] Date of Birth: 11/12/2000

## 2020-11-11 NOTE — Patient Instructions (Signed)
Access Code: [email or web address] URL: [email or web address] Date: 11/11/2020 Prepared by: Myrla Halsted  Exercises  Squat with Chair Touch - 1 x daily - 7 x weekly - 3 sets - 10 reps

## 2020-11-12 ENCOUNTER — Telehealth: Payer: Self-pay | Admitting: Orthopedic Surgery

## 2020-11-12 NOTE — Telephone Encounter (Signed)
Looks like xarelto 20mg  was sent in by patient's internal medicine doctor at appointment in June with start date of 11/11/20. I'd call the pharmacy to see if they have that prescription or call his IM physician

## 2020-11-12 NOTE — Telephone Encounter (Signed)
IC advised per below.  °

## 2020-11-12 NOTE — Telephone Encounter (Signed)
Mom called. Says he is out of medication. Would like something called in for the blood thinners. Her call back number is [phone]

## 2020-11-13 ENCOUNTER — Encounter: Payer: Self-pay | Admitting: Physical Therapy

## 2020-11-13 ENCOUNTER — Other Ambulatory Visit: Payer: Self-pay

## 2020-11-13 ENCOUNTER — Ambulatory Visit: Payer: Medicaid Other | Admitting: Physical Therapy

## 2020-11-13 DIAGNOSIS — M25562 Pain in left knee: Secondary | ICD-10-CM

## 2020-11-13 DIAGNOSIS — R2689 Other abnormalities of gait and mobility: Secondary | ICD-10-CM

## 2020-11-13 DIAGNOSIS — M6281 Muscle weakness (generalized): Secondary | ICD-10-CM

## 2020-11-13 NOTE — Therapy (Signed)
[facility] Outpatient Rehabilitation [facility] [address] [place], [place], [address] Phone: [phone]   Fax:  [phone]  Physical Therapy Treatment  Patient Details  Name: Jose Dennis MRN: [id number] Date of Birth: [birth year] Referring Provider (PT): August Saucer Corrie Mckusick, MD   Encounter Date: 11/13/2020   PT End of Session - 11/13/20 1410     Visit Number 5    Number of Visits 17    Date for PT Re-Evaluation 12/26/20    Authorization Type UHC MCD    Authorization - Number of Visits 27    PT Start Time 1314    PT Stop Time 1402    PT Time Calculation (min) 48 min    Activity Tolerance Patient tolerated treatment well;No increased pain    Behavior During Therapy [facility] for tasks assessed/performed             Past Medical History:  Diagnosis Date   Medical history non-contributory     Past Surgical History:  Procedure Laterality Date   ANTERIOR CRUCIATE LIGAMENT REPAIR Left 09/24/2020   Procedure: LEFT KNEE ARTHROSCOPY, ACL RECONSTRUCTION WITH QUAD AUTOGRAFT;  Surgeon: Cammy Copa, MD;  Location: [facility] OR;  Service: Orthopedics;  Laterality: Left;   NO PAST SURGERIES      There were no vitals filed for this visit.   Subjective Assessment - 11/13/20 1319     Subjective Patient reports good compliance with HEP including sit<>stand taps.    Pertinent History History of PE, blood thinners    Currently in Pain? No/denies    Pain Score 0-No pain   4/10 with flexion during bike   Pain Location Knee    Pain Orientation Left    Pain Descriptors / Indicators Tightness    Pain Type Surgical pain;Acute pain                [facility] PT Assessment - 11/13/20 0001       Assessment   Medical Diagnosis S/P ACL reconstruction (S28.315)    Referring Provider (PT) Cammy Copa, MD    Onset Date/Surgical Date 09/24/20    Next MD Visit ~7/25      Precautions   Precaution Comments history of PE      Restrictions   Other  Position/Activity Restrictions W/B as tolerated with no crutches by 6/29, per MD (HE NEEDS TO REGAIN QUAD CONTROL AND EXT FIRST)                           [facility] Adult PT Treatment/Exercise - 11/13/20 0001       Knee/Hip Exercises: Aerobic   Recumbent Bike able to make full revolutions, intitially slow      Knee/Hip Exercises: Machines for Strengthening   Cybex Leg Press 25# 2x5reps L LE      Knee/Hip Exercises: Standing   Heel Raises 20 reps;2 sets    Forward Lunges 10 reps;2 sets   BOSU dome up   Side Lunges 10 reps;2 sets   BOSU dome up   Terminal Knee Extension 15 reps;2 sets    Terminal Knee Extension Limitations BLUE    Lateral Step Up Limitations 4" up and over R/L x10ea dir    Step Down 10 reps    Step Down Limitations Dip down1"      Knee/Hip Exercises: Supine   Straight Leg Raises 15 reps;2 sets    Straight Leg Raises Limitations Initiate with quad set and completely relax between reps  Other Supine Knee/Hip Exercises prone ext, s/l abd/add 2#AW x10ea      Manual Therapy   Manual Therapy Joint mobilization;Soft tissue mobilization    Joint Mobilization Gr II                    PT Education - 11/13/20 1404     Education Details Addition of sidelying hip abd and prone hip ext and hamstring curl    Person(s) Educated Patient    Methods Explanation;Demonstration;Verbal cues;Handout    Comprehension Verbalized understanding;Returned demonstration              PT Short Term Goals - 11/11/20 1941       PT SHORT TERM GOAL #1   Title [name] will be >75% HEP compliant within 3 weeks to improve carryover between sessions and facilitate independent management of condition.    Status Achieved      PT SHORT TERM GOAL #2   Title [name] will be compliant with post op precautions throughout therapy.    Status Achieved               PT Long Term Goals - 10/31/20 1208       PT LONG TERM GOAL #1    Title [name] will be able to stand for >30'' in SLS stance, to show a significant improvement in balance in order to reduce fall risk  EVAL: unable      PT LONG TERM GOAL #2   Title Cashton Hosley will be able to perform squat to 90 degrees, not limited by pain  EVAL: not able    Target Date 12/26/20      PT LONG TERM GOAL #3   Title [name] will achieve 0-120 degrees knee ROM   EVAL: 7-81 degrees    Target Date 12/26/20                   Plan - 11/13/20 1411     Clinical Impression Statement Patient continues to make steady progress with knee stabilization and knee/hip strength. Patient extension now 0deg, continues to be challenged by decreased knee flexion and unsteadiness with SLS activity.  Patient will benefit from continued skilled PT to address deficits and maximize functional mobility and return to sports.    Examination-Activity Limitations Squat;Transfers;Stairs;Lift;Carry    Examination-Participation Restrictions Community Activity    PT Treatment/Interventions ADLs/Self Care Home Management;Aquatic Therapy;Gait training;Therapeutic activities;Therapeutic exercise;Neuromuscular re-education;Manual techniques;Dry needling    PT Next Visit Plan Continue with quad strengthening and flex ROM.  Hip strength and knee stability, balance and muscle endurance.    PT Home Exercise Plan [email or web address]    Consulted and Agree with Plan of Care Patient             Patient will benefit from skilled therapeutic intervention in order to improve the following deficits and impairments:  Abnormal gait, Decreased endurance, Decreased strength, Pain, Decreased balance, Decreased mobility  Visit Diagnosis: Acute pain of left knee  Balance problem  Muscle weakness  Other abnormalities of gait and mobility     Problem List Patient Active Problem List   Diagnosis Date Noted   Acute lateral meniscus tear of left knee    Acute medial meniscal tear,  left, subsequent encounter    Left ACL tear 06/09/2020   Pain in left knee 04/29/2020   Pain in left foot 07/07/2016    Myrla Halsted, PT 11/13/2020, 2:20 PM  [facility] Outpatient Rehabilitation [facility] [id number]  [address] [place], [place], [address] Phone: [phone]   Fax:  [phone]  Name: Sundance Moise MRN: [id number] Date of Birth: [birth year]

## 2020-11-13 NOTE — Patient Instructions (Addendum)
Access Code: [email or web address] URL: [email or web address] Date: 11/13/2020 Prepared by: Myrla Halsted  NEW Exercises  Prone Hip Extension - 1 x daily - 7 x weekly - 3 sets - 10 reps Sidelying Hip Adduction - 1 x daily - 7 x weekly - 3 sets - 10 reps Prone Knee Flexion - 1 x daily - 7 x weekly - 3 sets - 10 reps

## 2020-11-16 ENCOUNTER — Ambulatory Visit: Payer: Medicaid Other | Admitting: Physical Therapy

## 2020-11-17 ENCOUNTER — Encounter: Payer: Medicaid Other | Admitting: Surgical

## 2020-11-17 ENCOUNTER — Telehealth: Payer: Self-pay | Admitting: Physical Therapy

## 2020-11-17 NOTE — Telephone Encounter (Signed)
PT called patient at [phone] and left voice mail informing patient of missed appointment 7/11 at 5pm. Informed of next scheduled visit on WED 11/18/20 at 5pm and to call [phone] with questions.

## 2020-11-18 ENCOUNTER — Ambulatory Visit: Payer: Medicaid Other | Admitting: Physical Therapy

## 2020-11-18 ENCOUNTER — Encounter: Payer: Self-pay | Admitting: Physical Therapy

## 2020-11-18 ENCOUNTER — Other Ambulatory Visit: Payer: Self-pay

## 2020-11-18 DIAGNOSIS — R2689 Other abnormalities of gait and mobility: Secondary | ICD-10-CM

## 2020-11-18 DIAGNOSIS — M25562 Pain in left knee: Secondary | ICD-10-CM | POA: Diagnosis not present

## 2020-11-18 DIAGNOSIS — M6281 Muscle weakness (generalized): Secondary | ICD-10-CM

## 2020-11-18 NOTE — Therapy (Signed)
[facility] Outpatient Rehabilitation [facility] [address] [place], [place], [address] Phone: [phone]   Fax:  [phone]  Physical Therapy Treatment  Patient Details  Name: Jose Dennis MRN: [id number] Date of Birth: Jun 19, 2000 Referring Provider (PT): August Saucer Corrie Mckusick, MD   Encounter Date: 11/18/2020   PT End of Session - 11/18/20 1741     Visit Number 6    Number of Visits 17    Date for PT Re-Evaluation 12/26/20    Authorization Type UHC MCD    Authorization - Number of Visits 27    PT Start Time 1700    PT Stop Time 1742    PT Time Calculation (min) 42 min    Activity Tolerance Patient tolerated treatment well;No increased pain    Behavior During Therapy [facility] for tasks assessed/performed             Past Medical History:  Diagnosis Date   Medical history non-contributory     Past Surgical History:  Procedure Laterality Date   ANTERIOR CRUCIATE LIGAMENT REPAIR Left 09/24/2020   Procedure: LEFT KNEE ARTHROSCOPY, ACL RECONSTRUCTION WITH QUAD AUTOGRAFT;  Surgeon: Cammy Copa, MD;  Location: [facility] OR;  Service: Orthopedics;  Laterality: Left;   NO PAST SURGERIES      There were no vitals filed for this visit.       [facility] PT Assessment - 11/18/20 0001       Assessment   Medical Diagnosis S/P ACL reconstruction (T51.761)    Referring Provider (PT) Cammy Copa, MD    Onset Date/Surgical Date 09/24/20    Next MD Visit ~7/25      Precautions   Precaution Comments history of PE      Restrictions   Other Position/Activity Restrictions W/B as tolerated with no crutches by 6/29, per MD      AROM   Right Knee Extension 0    Left Knee Extension 0    Left Knee Flexion 100      PROM   Left Knee Extension 0    Left Knee Flexion 109                           [facility] Adult PT Treatment/Exercise - 11/18/20 0001       High Level Balance   High Level Balance Activities Braiding    High Level Balance  Comments Cross behind, opposite arm in front, R/L      Knee/Hip Exercises: Aerobic   Recumbent Bike L4x57min able to make full revolutions, intitially slow      Knee/Hip Exercises: Machines for Strengthening   Cybex Leg Press 25# 2x10reps L LE      Knee/Hip Exercises: Standing   Heel Raises 20 reps;2 sets    Heel Raises Limitations single x20 with R foot on chair    Forward Lunges 10 reps;2 sets    Side Lunges 10 reps;2 sets    Terminal Knee Extension 15 reps;2 sets    Terminal Knee Extension Limitations BLUE    Step Down Limitations Dip down1" L stance x10    Functional Squat 10 reps;2 sets    Functional Squat Limitations BOSU dome up    Rebounder Yellow ball B, R/L, AIREX B, R/L    Other Standing Knee Exercises High March x10                      PT Short Term Goals - 11/11/20 1941  PT SHORT TERM GOAL #1   Title [name] will be >75% HEP compliant within 3 weeks to improve carryover between sessions and facilitate independent management of condition.    Status Achieved      PT SHORT TERM GOAL #2   Title [name] will be compliant with post op precautions throughout therapy.    Status Achieved               PT Long Term Goals - 10/31/20 1208       PT LONG TERM GOAL #1   Title [name] will be able to stand for >30'' in SLS stance, to show a significant improvement in balance in order to reduce fall risk  EVAL: unable      PT LONG TERM GOAL #2   Title Jere Bostrom will be able to perform squat to 90 degrees, not limited by pain  EVAL: not able    Target Date 12/26/20      PT LONG TERM GOAL #3   Title [name] will achieve 0-120 degrees knee ROM   EVAL: 7-81 degrees    Target Date 12/26/20                   Plan - 11/18/20 1927     Clinical Impression Statement Patientstates he missed the last appointment because he came at his regular 630 time and we were closed, forgetting the  visit was at 5pm that day.  Patient called and left message at front desk. Patient reports fatigue after treatment, no increased pain.  Patient challenged by eccentric quad contraction.  Patient progressing well with ROM and strength, now ambulating without assistive device. Patient will benefit from continued skilled PT to address deficits and maximize functional mobility and return to sports.    Examination-Activity Limitations Squat;Transfers;Stairs;Lift;Carry    PT Treatment/Interventions ADLs/Self Care Home Management;Aquatic Therapy;Gait training;Therapeutic activities;Therapeutic exercise;Neuromuscular re-education;Manual techniques;Dry needling    PT Next Visit Plan Continue with quad strengthening and flex ROM.  Hip strength and knee stability, balance and muscle endurance.    PT Home Exercise Plan [email or web address]    Consulted and Agree with Plan of Care Patient             Patient will benefit from skilled therapeutic intervention in order to improve the following deficits and impairments:  Abnormal gait, Decreased endurance, Decreased strength, Pain, Decreased balance, Decreased mobility  Visit Diagnosis: Acute pain of left knee  Balance problem  Muscle weakness  Other abnormalities of gait and mobility     Problem List Patient Active Problem List   Diagnosis Date Noted   Acute lateral meniscus tear of left knee    Acute medial meniscal tear, left, subsequent encounter    Left ACL tear 06/09/2020   Pain in left knee 04/29/2020   Pain in left foot 07/07/2016    Myrla Halsted, PT 11/18/2020, 7:33 PM  [facility] [address] [place], [place], [address] Phone: [phone]   Fax:  [phone]  Name: Saverio Kader MRN: [id number] Date of Birth: 2001/03/01

## 2020-11-19 NOTE — Progress Notes (Signed)
Virtual Visit via Telephone Note  I connected with [name], on 11/20/2020 at 4:14 PM by telephone due to the COVID-19 pandemic and verified that I am speaking with the correct person using two identifiers.  Due to current restrictions/limitations of in-office visits due to the COVID-19 pandemic, this scheduled clinical appointment was converted to a telehealth visit.   Consent: I discussed the limitations, risks, security and privacy concerns of performing an evaluation and management service by telephone and the availability of in person appointments. I also discussed with the patient that there may be a patient responsible charge related to this service. The patient expressed understanding and agreed to proceed.   Location of Patient: Home  Location of Provider: [facility]   Persons participating in Telemedicine visit: [name] Ricky Stabs, NP Margorie John, CMA   History of Present Illness: Heinrich Fertig in a 20 year-old male who presents to establish care.   Current issues and/or concerns: None.   Past Medical History:  Diagnosis Date   Medical history non-contributory    No Known Allergies  Current Outpatient Medications on File Prior to Visit  Medication Sig Dispense Refill   acetaminophen (TYLENOL) 325 MG tablet Take 650 mg by mouth every 6 (six) hours as needed for moderate pain. (Patient not taking: Reported on 10/31/2020)     apixaban (ELIQUIS) 5 MG TABS tablet Take 2 tablets (10mg ) twice daily for 7 days, then 1 tablet (5mg ) twice daily (Patient not taking: Reported on 10/31/2020) 60 tablet 0   IBUPROFEN PO Take 1-2 tablets by mouth every 6 (six) hours as needed (headache). (Patient not taking: Reported on 10/31/2020)     ketorolac (TORADOL) 10 MG tablet Take 1 tablet (10 mg total) by mouth every 8 (eight) hours as needed. (Patient not taking: Reported on 10/31/2020) 15 tablet 0   methocarbamol (ROBAXIN)  500 MG tablet Take 1 tablet (500 mg total) by mouth every 8 (eight) hours as needed. (Patient not taking: Reported on 10/31/2020) 30 tablet 0   naproxen (NAPROSYN) 500 MG tablet Take 1 tablet (500 mg total) by mouth 2 (two) times daily with a meal. (Patient not taking: Reported on 10/31/2020) 30 tablet 0   oxyCODONE-acetaminophen (PERCOCET) 5-325 MG tablet Take 1 tablet by mouth every 4 (four) hours as needed for severe pain. (Patient not taking: Reported on 10/31/2020) 30 tablet 0   RIVAROXABAN (XARELTO) VTE STARTER PACK (15 & 20 MG) Follow package directions: Take one 15mg  tablet by mouth twice a day. On day 22, switch to one 20mg  tablet once a day. Take with food. 51 each 0   tiZANidine (ZANAFLEX) 4 MG tablet Take 1 tablet (4 mg total) by mouth every 8 (eight) hours as needed. (Patient not taking: Reported on 10/31/2020) 30 tablet 0   No current facility-administered medications on file prior to visit.    Observations/Objective: Alert and oriented x 3. Not in acute distress. Physical examination not completed as this is a telemedicine visit.  Assessment and Plan: 1. Encounter to establish care: - Patient presents today to establish care.  - Return for annual physical examination, labs, and health maintenance. Arrive fasting meaning having no food for at least 8 hours prior to appointment. You may have only water or black coffee. Please take scheduled medications as normal.   Follow Up Instructions: Return for annual physical exam.    Patient was given clear instructions to go to Emergency Department or return to medical center if symptoms don't  improve, worsen, or new problems develop.The patient verbalized understanding.  I discussed the assessment and treatment plan with the patient. The patient was provided an opportunity to ask questions and all were answered. The patient agreed with the plan and demonstrated an understanding of the instructions.   The patient was advised to call back or  seek an in-person evaluation if the symptoms worsen or if the condition fails to improve as anticipated.    I provided 5 minutes total of non-face-to-face time during this encounter.   Rema Fendt, NP  [facility] Care at [facility] [place], [place] [phone] 11/20/2020, 4:14 PM

## 2020-11-20 ENCOUNTER — Other Ambulatory Visit: Payer: Self-pay

## 2020-11-20 ENCOUNTER — Telehealth (INDEPENDENT_AMBULATORY_CARE_PROVIDER_SITE_OTHER): Payer: Medicaid Other | Admitting: Family

## 2020-11-20 DIAGNOSIS — Z7689 Persons encountering health services in other specified circumstances: Secondary | ICD-10-CM

## 2020-11-20 NOTE — Progress Notes (Signed)
Pt presents to establish care 

## 2020-11-23 ENCOUNTER — Other Ambulatory Visit: Payer: Self-pay

## 2020-11-23 ENCOUNTER — Encounter: Payer: Self-pay | Admitting: Physical Therapy

## 2020-11-23 ENCOUNTER — Ambulatory Visit: Payer: Medicaid Other | Admitting: Physical Therapy

## 2020-11-23 DIAGNOSIS — M25562 Pain in left knee: Secondary | ICD-10-CM | POA: Diagnosis not present

## 2020-11-23 DIAGNOSIS — R2689 Other abnormalities of gait and mobility: Secondary | ICD-10-CM

## 2020-11-23 DIAGNOSIS — M6281 Muscle weakness (generalized): Secondary | ICD-10-CM

## 2020-11-23 NOTE — Patient Instructions (Signed)
Access Code: [email or web address] URL: [email or web address] Date: 11/23/2020 Prepared by: Myrla Halsted  NEW Exercises  Lateral Lunge - 1 x daily - 7 x weekly - 2 sets - 12 reps Reverse Lunge - 1 x daily - 7 x weekly - 3 sets - 12 reps Forward eccentric Step Down - 1 x daily - 7 x weekly - 2 sets - 10 reps

## 2020-11-23 NOTE — Therapy (Signed)
[facility] Outpatient Rehabilitation [facility] [address] [place], [place], [address] Phone: [phone]   Fax:  [phone]  Physical Therapy Treatment  Patient Details  Name: Jose Dennis MRN: [id number] Date of Birth: 03-10-2001 Referring Provider (PT): August Saucer Corrie Mckusick, MD   Encounter Date: 11/23/2020   PT End of Session - 11/23/20 1801     Visit Number 7    Number of Visits 17    Date for PT Re-Evaluation 12/26/20    Authorization Type U[employer] MCD    Authorization - Number of Visits 27    PT Start Time 1702    PT Stop Time 1745    PT Time Calculation (min) 43 min    Activity Tolerance Patient tolerated treatment well;No increased pain    Behavior During Therapy [name] for tasks assessed/performed             Past Medical History:  Diagnosis Date   Medical history non-contributory     Past Surgical History:  Procedure Laterality Date   ANTERIOR CRUCIATE LIGAMENT REPAIR Left 09/24/2020   Procedure: LEFT KNEE ARTHROSCOPY, ACL RECONSTRUCTION WITH QUAD AUTOGRAFT;  Surgeon: Cammy Copa, MD;  Location: [facility] OR;  Service: Orthopedics;  Laterality: Left;   NO PAST SURGERIES      There were no vitals filed for this visit.   Subjective Assessment - 11/23/20 1704     Subjective Patient reports good compliance with HEP.    Pertinent History History of PE, blood thinners                [facility] PT Assessment - 11/23/20 0001       Assessment   Medical Diagnosis S/P ACL reconstruction (L29.574)    Referring Provider (PT) Cammy Copa, MD    Onset Date/Surgical Date 09/24/20      Precautions   Precaution Comments history of PE                           [facility] Adult PT Treatment/Exercise - 11/23/20 0001       Knee/Hip Exercises: Aerobic   Recumbent Bike L4x36min able to make full revolutions, intitially slow      Knee/Hip Exercises: Standing   Forward Lunges 10 reps;2 sets   REVERSE   Side Lunges 10 reps;2  sets    Lateral Step Up Limitations 6" up and over R/L x10ea dir    Step Down Limitations Dip down1" L stance x10    Walking with Sports Cord 20# fwd/bk x5, 20# R/L side x 5    Other Standing Knee Exercises Floor ladder inand outs, diagonal; flat foot then on toes 2 rounds ea.      Manual Therapy   Manual Therapy Joint mobilization;Soft tissue mobilization    Joint Mobilization Gr III/A, tib on femur and fib on femur    Soft tissue mobilization STM distal quad and distl hamstring                    PT Education - 11/23/20 1707     Education Details Even pressure with sit <> stands. Addition to HEP: back and lateral lunges.    Person(s) Educated Patient    Methods Explanation;Demonstration;Verbal cues    Comprehension Verbalized understanding              PT Short Term Goals - 11/11/20 1941       PT SHORT TERM GOAL #1   Title [name] will  be >75% HEP compliant within 3 weeks to improve carryover between sessions and facilitate independent management of condition.    Status Achieved      PT SHORT TERM GOAL #2   Title [name] will be compliant with post op precautions throughout therapy.    Status Achieved               PT Long Term Goals - 10/31/20 1208       PT LONG TERM GOAL #1   Title [name] will be able to stand for >30'' in SLS stance, to show a significant improvement in balance in order to reduce fall risk  EVAL: unable      PT LONG TERM GOAL #2   Title [name] will be able to perform squat to 90 degrees, not limited by pain  EVAL: not able    Target Date 12/26/20      PT LONG TERM GOAL #3   Title [name] will achieve 0-120 degrees knee ROM   EVAL: 7-81 degrees    Target Date 12/26/20                   Plan - 11/23/20 1801     Clinical Impression Statement Patient tolerated addition of resisted walking and beginner agility today.  Patient ROM continues to improve L  knee 0-110active, 0-115passive. Patient will benefit from continued skilled PT to address deficits and maximize functional mobility and return to sports.    Examination-Activity Limitations Squat;Transfers;Stairs;Lift;Carry    Examination-Participation Restrictions Community Activity    PT Treatment/Interventions ADLs/Self Care Home Management;Aquatic Therapy;Gait training;Therapeutic activities;Therapeutic exercise;Neuromuscular re-education;Manual techniques;Dry needling    PT Next Visit Plan Continue with quad strengthening and flex ROM.  Hip strength and knee stability, balance and muscle endurance. Progress agility in controlled range.    PT Home Exercise Plan [email or web address]    Consulted and Agree with Plan of Care Patient             Patient will benefit from skilled therapeutic intervention in order to improve the following deficits and impairments:  Abnormal gait, Decreased endurance, Decreased strength, Pain, Decreased balance, Decreased mobility  Visit Diagnosis: Acute pain of left knee  Balance problem  Muscle weakness  Other abnormalities of gait and mobility     Problem List Patient Active Problem List   Diagnosis Date Noted   Acute lateral meniscus tear of left knee    Acute medial meniscal tear, left, subsequent encounter    Left ACL tear 06/09/2020   Pain in left knee 04/29/2020   Pain in left foot 07/07/2016    Myrla Halsted, PT 11/23/2020, 6:05 PM  [facility] [address] [place], [place], [address] Phone: [phone]   Fax:  [phone]  Name: Jose Dennis MRN: [id number] Date of Birth: April 05, 2001

## 2020-11-25 ENCOUNTER — Ambulatory Visit: Payer: Medicaid Other | Admitting: Physical Therapy

## 2020-11-25 ENCOUNTER — Other Ambulatory Visit: Payer: Self-pay

## 2020-11-25 ENCOUNTER — Encounter: Payer: Self-pay | Admitting: Physical Therapy

## 2020-11-25 DIAGNOSIS — R2689 Other abnormalities of gait and mobility: Secondary | ICD-10-CM

## 2020-11-25 DIAGNOSIS — M25562 Pain in left knee: Secondary | ICD-10-CM | POA: Diagnosis not present

## 2020-11-25 DIAGNOSIS — M6281 Muscle weakness (generalized): Secondary | ICD-10-CM

## 2020-11-25 NOTE — Therapy (Signed)
[facility] Outpatient Rehabilitation [facility] [address] [place], [place], [address] Phone: [phone]   Fax:  [phone]  Physical Therapy Treatment  Patient Details  Name: Jose Dennis MRN: [id number] Date of Birth: 26-May-2000 Referring Provider (PT): August Saucer Corrie Mckusick, MD   Encounter Date: 11/25/2020   PT End of Session - 11/25/20 1708     Visit Number 8    Number of Visits 17    Date for PT Re-Evaluation 12/26/20    Authorization Type UHC MCD    Authorization - Number of Visits 27    PT Start Time 1702    PT Stop Time 1744    PT Time Calculation (min) 42 min    Activity Tolerance Patient tolerated treatment well;No increased pain    Behavior During Therapy [facility] for tasks assessed/performed             Past Medical History:  Diagnosis Date   Medical history non-contributory     Past Surgical History:  Procedure Laterality Date   ANTERIOR CRUCIATE LIGAMENT REPAIR Left 09/24/2020   Procedure: LEFT KNEE ARTHROSCOPY, ACL RECONSTRUCTION WITH QUAD AUTOGRAFT;  Surgeon: Cammy Copa, MD;  Location: [facility] OR;  Service: Orthopedics;  Laterality: Left;   NO PAST SURGERIES      There were no vitals filed for this visit.   Subjective Assessment - 11/25/20 1706     Subjective Patient reports good compliance with HEP.    Pertinent History History of PE, blood thinners    Pain Score 0-No pain   Soreness   Pain Location Knee                [facility] PT Assessment - 11/25/20 0001       Assessment   Medical Diagnosis S/P ACL reconstruction (B01.751)    Referring Provider (PT) Cammy Copa, MD    Onset Date/Surgical Date 09/24/20                           [facility] Adult PT Treatment/Exercise - 11/25/20 0001       High Level Balance   High Level Balance Activities Braiding    High Level Balance Comments Cross behind, opposite arm in front, R/L      Knee/Hip Exercises: Aerobic   Recumbent Bike L4x74min able to  make full revolutions, intitially slow      Knee/Hip Exercises: Machines for Strengthening   Cybex Leg Press 35# 2x10reps L LE    Other Machine 4WAY hip 37.5# 2x10ea dir      Knee/Hip Exercises: Standing   Step Down Limitations Dip down1" L stance x10    Walking with Sports Cord 20# fwd/bk x5, 20# R/L side x 5    Other Standing Knee Exercises Floor ladder inand outs, diagonal;on toes 3rounds ea.      Manual Therapy   Manual Therapy Joint mobilization;Soft tissue mobilization    Joint Mobilization Gr III/A, tib on femur and fib on femur    Soft tissue mobilization STM distal quad and distl hamstring                    PT Education - 11/25/20 1707     Education Details Continue current HEP.    Person(s) Educated Patient    Methods Explanation;Verbal cues    Comprehension Verbalized understanding              PT Short Term Goals - 11/11/20 1941  PT SHORT TERM GOAL #1   Title [name] will be >75% HEP compliant within 3 weeks to improve carryover between sessions and facilitate independent management of condition.    Status Achieved      PT SHORT TERM GOAL #2   Title [name] will be compliant with post op precautions throughout therapy.    Status Achieved               PT Long Term Goals - 10/31/20 1208       PT LONG TERM GOAL #1   Title [name] will be able to stand for >30'' in SLS stance, to show a significant improvement in balance in order to reduce fall risk  EVAL: unable      PT LONG TERM GOAL #2   Title [name] will be able to perform squat to 90 degrees, not limited by pain  EVAL: not able    Target Date 12/26/20      PT LONG TERM GOAL #3   Title [name] will achieve 0-120 degrees knee ROM   EVAL: 7-81 degrees    Target Date 12/26/20                   Plan - 11/25/20 1708     Clinical Impression Statement Patient tolerated progressive exercises with improved  knee stability and improved ROM L knee 0-112active, 0-117active. Patient will benefit from continued skilled PT to address deficits and maximize functional mobility and return to sports.    Examination-Activity Limitations Squat;Transfers;Stairs;Lift;Carry    PT Treatment/Interventions ADLs/Self Care Home Management;Aquatic Therapy;Gait training;Therapeutic activities;Therapeutic exercise;Neuromuscular re-education;Manual techniques;Dry needling    PT Next Visit Plan Continue with quad strengthening and flex ROM.  Hip strength and knee stability, balance and muscle endurance. Progress agility in controlled range.    PT Home Exercise Plan [email or web address]    Consulted and Agree with Plan of Care Patient             Patient will benefit from skilled therapeutic intervention in order to improve the following deficits and impairments:  Abnormal gait, Decreased endurance, Decreased strength, Pain, Decreased balance, Decreased mobility  Visit Diagnosis: Acute pain of left knee  Muscle weakness  Balance problem  Other abnormalities of gait and mobility     Problem List Patient Active Problem List   Diagnosis Date Noted   Acute lateral meniscus tear of left knee    Acute medial meniscal tear, left, subsequent encounter    Left ACL tear 06/09/2020   Pain in left knee 04/29/2020   Pain in left foot 07/07/2016    Myrla Halsted, PT 11/25/2020, 5:46 PM  [facility] [address] [place], [place], [address] Phone: [phone]   Fax:  [phone]  Name: Jose Dennis MRN: [id number] Date of Birth: 06-10-2000

## 2020-11-26 ENCOUNTER — Ambulatory Visit (INDEPENDENT_AMBULATORY_CARE_PROVIDER_SITE_OTHER): Payer: Medicaid Other | Admitting: Orthopedic Surgery

## 2020-11-26 ENCOUNTER — Encounter: Payer: Self-pay | Admitting: Orthopedic Surgery

## 2020-11-26 DIAGNOSIS — Z9889 Other specified postprocedural states: Secondary | ICD-10-CM

## 2020-11-26 NOTE — Progress Notes (Signed)
   Post-Op Visit Note   Patient: Jose Dennis           Date of Birth: Sep 21, 2000           MRN: [id number] Visit Date: 11/26/2020 PCP: Pcp, No   Assessment & Plan:  Chief Complaint:  Chief Complaint  Patient presents with   Left Knee - Follow-up    09/24/2020 left knee ACL reconstruction   Visit Diagnoses:  1. S/P ACL reconstruction     Plan: Patient is a 20 year old male who presents s/p left knee anterior cruciate ligament reconstruction with inside-out medial meniscal repair and partial lateral meniscectomy.  He reports that he is doing well with no significant issues.  He is currently full weightbearing without crutches.  He does note occasional twinges of pain on the medial side of his knee when he walks but this is uncommon and is not associated with any instability or mechanical symptoms.  He is in physical therapy working on range of motion and quad strengthening exercises as well as working on balance, ladder work, stationary bike.  He reaches 115 degrees of flexion in physical therapy.  He is not taking any pain medication.  He is still compliant with taking Xarelto and has scheduled physical with his primary care physician in September.  He has been instructed to take Xarelto for 3 months following his DVT.  He denies any calf pain, chest pain, shortness of breath.  On exam he has no effusion with incisions that are well-healed.  Excellent quadricep strength and able to perform multiple straight leg raises without difficulty or extensor lag.  No calf tenderness.  Negative Homans' sign.  ACL is stable on Lachman exam and by anterior drawer with no discernible laxity.  He has 0 degrees extension and 110 degrees of knee flexion on exam today.  Plan to continue with physical therapy exercises and follow-up in 8 weeks for clinical recheck.  Cautioned him against any loaded knee flexion past 90 degrees or any significant physical activity aside from walking or stationary bike for  cardio.  Follow-Up Instructions: No follow-ups on file.   Orders:  No orders of the defined types were placed in this encounter.  No orders of the defined types were placed in this encounter.   Imaging: No results found.  PMFS History: Patient Active Problem List   Diagnosis Date Noted   Acute lateral meniscus tear of left knee    Acute medial meniscal tear, left, subsequent encounter    Left ACL tear 06/09/2020   Pain in left knee 04/29/2020   Pain in left foot 07/07/2016   Past Medical History:  Diagnosis Date   Medical history non-contributory     Family History  Family history unknown: Yes    Past Surgical History:  Procedure Laterality Date   ANTERIOR CRUCIATE LIGAMENT REPAIR Left 09/24/2020   Procedure: LEFT KNEE ARTHROSCOPY, ACL RECONSTRUCTION WITH QUAD AUTOGRAFT;  Surgeon: Cammy Copa, MD;  Location: [facility] OR;  Service: Orthopedics;  Laterality: Left;   NO PAST SURGERIES     Social History   Occupational History   Not on file  Tobacco Use   Smoking status: Never   Smokeless tobacco: Never  Vaping Use   Vaping Use: Some days  Substance and Sexual Activity   Alcohol use: No   Drug use: No   Sexual activity: Not on file

## 2020-11-30 ENCOUNTER — Other Ambulatory Visit: Payer: Self-pay

## 2020-11-30 ENCOUNTER — Ambulatory Visit: Payer: Medicaid Other | Admitting: Physical Therapy

## 2020-11-30 ENCOUNTER — Encounter: Payer: Self-pay | Admitting: Physical Therapy

## 2020-11-30 DIAGNOSIS — M6281 Muscle weakness (generalized): Secondary | ICD-10-CM

## 2020-11-30 DIAGNOSIS — R2689 Other abnormalities of gait and mobility: Secondary | ICD-10-CM

## 2020-11-30 DIAGNOSIS — M25562 Pain in left knee: Secondary | ICD-10-CM

## 2020-11-30 NOTE — Patient Instructions (Signed)
Access Code: [email or web address] URL: [email or web address] Date: 11/30/2020 Prepared by: Myrla Halsted  NEW Exercises  Sidelying toe taps for glute med strength - 1 x daily - 7 x weekly - 3 sets - 10 reps

## 2020-11-30 NOTE — Therapy (Signed)
[facility] Outpatient Rehabilitation [facility] [address] [place], [place], [address] Phone: [phone]   Fax:  [phone]  Physical Therapy Treatment  Patient Details  Name: Jose Dennis MRN: [id number] Date of Birth: 21-Feb-2001 Referring Provider (PT): August Saucer Corrie Mckusick, MD   Encounter Date: 11/30/2020   PT End of Session - 11/30/20 1758     Visit Number 9    Number of Visits 17    Date for PT Re-Evaluation 12/26/20    Authorization Type UHC MCD 27 visits    Authorization - Number of Visits 27    PT Start Time 1714    PT Stop Time 1750    PT Time Calculation (min) 36 min    Activity Tolerance Patient tolerated treatment well;No increased pain    Behavior During Therapy [facility] for tasks assessed/performed             Past Medical History:  Diagnosis Date   Medical history non-contributory     Past Surgical History:  Procedure Laterality Date   ANTERIOR CRUCIATE LIGAMENT REPAIR Left 09/24/2020   Procedure: LEFT KNEE ARTHROSCOPY, ACL RECONSTRUCTION WITH QUAD AUTOGRAFT;  Surgeon: Cammy Copa, MD;  Location: [facility] OR;  Service: Orthopedics;  Laterality: Left;   NO PAST SURGERIES      There were no vitals filed for this visit.   Subjective Assessment - 11/30/20 1720     Subjective I'm tired, was hard to get out of bed today because it's just gloomy outside.    Pertinent History History of PE, blood thinners    Currently in Pain? No/denies    Pain Score 0-No pain    Pain Location Knee    Pain Orientation Left                [facility] PT Assessment - 11/30/20 0001       Assessment   Medical Diagnosis S/P ACL reconstruction (F12.197)    Referring Provider (PT) Cammy Copa, MD    Onset Date/Surgical Date 09/24/20    Next MD Visit 11/26/2020      Precautions   Precaution Comments history of PE      Restrictions   Other Position/Activity Restrictions W/B as tolerated with no crutches by 6/29, per MD                            [facility] Adult PT Treatment/Exercise - 11/30/20 0001       Knee/Hip Exercises: Aerobic   Recumbent Bike L4x22min able to make full revolutions, intitially slow      Knee/Hip Exercises: Machines for Strengthening   Cybex Leg Press 40# 2x10reps L LE      Knee/Hip Exercises: Standing   Forward Lunges 10 reps;2 sets    Lateral Step Up Limitations 8" up and over R/L x10ea dir    Step Down Limitations Dip down1" L stance x10    Walking with Sports Cord 23# fwd/bk x3, 23# R/L side x 3, 23# bk/fwd x3    Other Standing Knee Exercises TRX squat SL squat 2x10   required practice with non-surgical leg     Knee/Hip Exercises: Sidelying   Other Sidelying Knee/Hip Exercises Glute med toe tap fwd/back x15 ea                    PT Education - 11/30/20 1754     Education Details Verbal and visual cues for square shoulders/hips with dips leading to assessment of  glute med. Addition of side lying glute med toe taps.    Person(s) Educated Patient    Methods Explanation;Demonstration;Verbal cues    Comprehension Verbalized understanding;Returned demonstration              PT Short Term Goals - 11/11/20 1941       PT SHORT TERM GOAL #1   Title [name] will be >75% HEP compliant within 3 weeks to improve carryover between sessions and facilitate independent management of condition.    Status Achieved      PT SHORT TERM GOAL #2   Title [name] will be compliant with post op precautions throughout therapy.    Status Achieved               PT Long Term Goals - 10/31/20 1208       PT LONG TERM GOAL #1   Title [name] will be able to stand for >30'' in SLS stance, to show a significant improvement in balance in order to reduce fall risk  EVAL: unable      PT LONG TERM GOAL #2   Title Fisher Hargadon will be able to perform squat to 90 degrees, not limited by pain  EVAL: not able    Target Date 12/26/20       PT LONG TERM GOAL #3   Title [name] will achieve 0-120 degrees knee ROM   EVAL: 7-81 degrees    Target Date 12/26/20                   Plan - 11/30/20 1801     Clinical Impression Statement Patient 14 min late for PT.  Patient reports visit to MD last Thursday reporting he is satisfied with progress so far, did indicate to patient that running is still not okay and needs to wait until about the 4 month post op time (surgical date 09/24/20). Patient  with steady progress for strength and stability of L knee.  Patient demonstrated L glute med weakness today, given addition to HEP.  Patient will benefit from continued skilled PT to address deficits and maximize functional mobility and return to sports.    Examination-Activity Limitations Squat;Transfers;Stairs;Lift;Carry    Examination-Participation Restrictions Community Activity    PT Treatment/Interventions ADLs/Self Care Home Management;Aquatic Therapy;Gait training;Therapeutic activities;Therapeutic exercise;Neuromuscular re-education;Manual techniques;Dry needling    PT Next Visit Plan Continue with quad strengthening and flex ROM.  Hip strength and knee stability, balance and muscle endurance. Progress agility in controlled range/speed, no cutting/pivoting yet.    PT Home Exercise Plan [email or web address]    Consulted and Agree with Plan of Care Patient             Patient will benefit from skilled therapeutic intervention in order to improve the following deficits and impairments:  Abnormal gait, Decreased endurance, Decreased strength, Pain, Decreased balance, Decreased mobility  Visit Diagnosis: Acute pain of left knee  Muscle weakness  Balance problem  Other abnormalities of gait and mobility     Problem List Patient Active Problem List   Diagnosis Date Noted   Acute lateral meniscus tear of left knee    Acute medial meniscal tear, left, subsequent encounter    Left ACL tear 06/09/2020   Pain in  left knee 04/29/2020   Pain in left foot 07/07/2016    Myrla Halsted, PT 11/30/2020, 6:07 PM  [facility] Outpatient [facility] [address] [place], [place], [address] Phone: [phone]   Fax:  [phone]  Name: Raihan Kimmel MRN: [id number] Date of Birth: [birth year]

## 2020-12-02 ENCOUNTER — Ambulatory Visit: Payer: Medicaid Other | Admitting: Physical Therapy

## 2020-12-02 ENCOUNTER — Other Ambulatory Visit: Payer: Self-pay

## 2020-12-02 ENCOUNTER — Encounter: Payer: Self-pay | Admitting: Physical Therapy

## 2020-12-02 DIAGNOSIS — M25562 Pain in left knee: Secondary | ICD-10-CM | POA: Diagnosis not present

## 2020-12-02 DIAGNOSIS — M6281 Muscle weakness (generalized): Secondary | ICD-10-CM

## 2020-12-02 DIAGNOSIS — R2689 Other abnormalities of gait and mobility: Secondary | ICD-10-CM

## 2020-12-02 NOTE — Therapy (Signed)
[facility] Outpatient Rehabilitation [facility] [address] [place], [place], [address] Phone: [phone]   Fax:  [phone]  Physical Therapy Treatment  Patient Details  Name: Jose Dennis MRN: [id number] Date of Birth: 03-15-2001 Referring Provider (PT): August Saucer Corrie Mckusick, MD   Encounter Date: 12/02/2020   PT End of Session - 12/02/20 1800     Visit Number 10    Number of Visits 17    Date for PT Re-Evaluation 12/26/20    Authorization Type U[employer] MCD 27 visits    Authorization - Number of Visits 27    PT Start Time 1710    PT Stop Time 1758    PT Time Calculation (min) 48 min    Activity Tolerance Patient tolerated treatment well;No increased pain    Behavior During Therapy [facility] for tasks assessed/performed             Past Medical History:  Diagnosis Date   Medical history non-contributory     Past Surgical History:  Procedure Laterality Date   ANTERIOR CRUCIATE LIGAMENT REPAIR Left 09/24/2020   Procedure: LEFT KNEE ARTHROSCOPY, ACL RECONSTRUCTION WITH QUAD AUTOGRAFT;  Surgeon: Cammy Copa, MD;  Location: [facility] OR;  Service: Orthopedics;  Laterality: Left;   NO PAST SURGERIES      There were no vitals filed for this visit.   Subjective Assessment - 12/02/20 1754     Subjective Patient states he is a little stiff and irritated today left medial/post knee.    Pain Score 0-No pain    Pain Location Knee    Pain Orientation Left    Pain Descriptors / Indicators Tightness                [facility] PT Assessment - 12/02/20 0001       AROM   Left Knee Flexion 114   Supine     PROM   Left Knee Flexion 121   Supine                          [facility] Adult PT Treatment/Exercise - 12/02/20 0001       Knee/Hip Exercises: Aerobic   Elliptical L5 x    Recumbent Bike L5x94min able to make full revolutions, intitially slow      Knee/Hip Exercises: Machines for Strengthening   Cybex Leg Press 45# 2x10reps L LE       Knee/Hip Exercises: Standing   Heel Raises 20 reps    Heel Raises Limitations single x20 with R foot on chair    Forward Step Up 10 reps;2 sets    Forward Step Up Limitations 8" with high opp hip flexion    Step Down Limitations Dip down1" L stance x10, v cues for control eccentric.    Other Standing Knee Exercises Bridge on ball B/R/L      Knee/Hip Exercises: Supine   Straight Leg Raises 15 reps;2 sets    Straight Leg Raises Limitations 4#AW Initiate with quad set and completely relax between reps      Knee/Hip Exercises: Sidelying   Hip ABduction 15 reps    Hip ABduction Limitations 4#AW    Hip ADduction 15 reps    Hip ADduction Limitations 4#AW      Knee/Hip Exercises: Prone   Hamstring Curl 15 reps    Hamstring Curl Limitations 4#AW    Hip Extension 15 reps    Hip Extension Limitations 4#AW      Manual Therapy  Manual Therapy Joint mobilization;Soft tissue mobilization    Joint Mobilization Gr III/A, tib on femur and fib on femur    Soft tissue mobilization STM distal quad and distl hamstring                    PT Education - 12/02/20 1759     Education Details Continue verbal cues for square shoulders/hips during exercises and for good eccentric control.    Person(s) Educated Patient    Methods Explanation;Verbal cues    Comprehension Verbalized understanding              PT Short Term Goals - 11/11/20 1941       PT SHORT TERM GOAL #1   Title [name] will be >75% HEP compliant within 3 weeks to improve carryover between sessions and facilitate independent management of condition.    Status Achieved      PT SHORT TERM GOAL #2   Title [name] will be compliant with post op precautions throughout therapy.    Status Achieved               PT Long Term Goals - 10/31/20 1208       PT LONG TERM GOAL #1   Title [name] will be able to stand for >30'' in SLS stance, to show a significant improvement in  balance in order to reduce fall risk  EVAL: unable      PT LONG TERM GOAL #2   Title Jyron Turman will be able to perform squat to 90 degrees, not limited by pain  EVAL: not able    Target Date 12/26/20      PT LONG TERM GOAL #3   Title [name] will achieve 0-120 degrees knee ROM   EVAL: 7-81 degrees    Target Date 12/26/20                   Plan - 12/02/20 1802     Clinical Impression Statement Patient presents with c/o mild stiffness/irriation today medial/post L knee.  MD at prior visit reinforced no running until approx 4 month post op (surgical date 09/24/20).  Patient continues with good progress, did focus on some hip strengthening and less weight bearing knee flexion today.  Patient reports feeling better after treatment.  Patient with some glute med weakness demonstrated.  Patient will benefit from continued skilled PT to address deficits and maximize functional community mobility and return to sports.    Examination-Activity Limitations Squat;Transfers;Stairs;Lift;Carry    Examination-Participation Restrictions Community Activity    PT Treatment/Interventions ADLs/Self Care Home Management;Aquatic Therapy;Gait training;Therapeutic activities;Therapeutic exercise;Neuromuscular re-education;Manual techniques;Dry needling    PT Next Visit Plan Continue with quad strengthening and flex ROM.  Hip strength and knee stability, balance and muscle endurance. Progress agility in controlled range/speed, no cutting/pivoting yet.    PT Home Exercise Plan [id number]    Consulted and Agree with Plan of Care Patient             Patient will benefit from skilled therapeutic intervention in order to improve the following deficits and impairments:  Abnormal gait, Decreased endurance, Decreased strength, Pain, Decreased balance, Decreased mobility  Visit Diagnosis: Acute pain of left knee  Muscle weakness  Balance problem  Other abnormalities of gait and  mobility     Problem List Patient Active Problem List   Diagnosis Date Noted   Acute lateral meniscus tear of left knee    Acute medial meniscal tear, left,  subsequent encounter    Left ACL tear 06/09/2020   Pain in left knee 04/29/2020   Pain in left foot 07/07/2016    Myrla Halsted, PT 12/02/2020, 6:13 PM  [facility] [address] [place], [place], [address] Phone: [phone]   Fax:  [phone]  Name: Derak Schurman MRN: [id number] Date of Birth: [birth year]

## 2020-12-07 ENCOUNTER — Other Ambulatory Visit: Payer: Self-pay

## 2020-12-07 ENCOUNTER — Ambulatory Visit: Payer: Medicaid Other | Attending: Orthopedic Surgery

## 2020-12-07 DIAGNOSIS — R2689 Other abnormalities of gait and mobility: Secondary | ICD-10-CM | POA: Diagnosis present

## 2020-12-07 DIAGNOSIS — M6281 Muscle weakness (generalized): Secondary | ICD-10-CM | POA: Insufficient documentation

## 2020-12-07 DIAGNOSIS — M25562 Pain in left knee: Secondary | ICD-10-CM | POA: Insufficient documentation

## 2020-12-07 NOTE — Therapy (Signed)
[facility] Outpatient Rehabilitation [facility] [address] [place], [place], [address] Phone: [phone]   Fax:  [phone]  Physical Therapy Treatment  Patient Details  Name: Jose Dennis MRN: [id number] Date of Birth: 14-May-2000 Referring Provider (PT): August Saucer Corrie Mckusick, MD   Encounter Date: 12/07/2020   PT End of Session - 12/07/20 1748     Visit Number 11    Number of Visits 17    Date for PT Re-Evaluation 12/26/20    Authorization Type UHC MCD 27 visits    Authorization - Number of Visits 27    PT Start Time 1700    PT Stop Time 1745    PT Time Calculation (min) 45 min    Activity Tolerance Patient tolerated treatment well;No increased pain    Behavior During Therapy [facility] for tasks assessed/performed             Past Medical History:  Diagnosis Date   Medical history non-contributory     Past Surgical History:  Procedure Laterality Date   ANTERIOR CRUCIATE LIGAMENT REPAIR Left 09/24/2020   Procedure: LEFT KNEE ARTHROSCOPY, ACL RECONSTRUCTION WITH QUAD AUTOGRAFT;  Surgeon: Cammy Copa, MD;  Location: [facility] OR;  Service: Orthopedics;  Laterality: Left;   NO PAST SURGERIES      There were no vitals filed for this visit.   Subjective Assessment - 12/07/20 1708     Subjective Pt reports mild soreness on the medial aspect of his L knee today, which he attributes to increased exercises recently. He reports adherence to his HEP and is ready to initiate PT today.    Currently in Pain? Yes    Pain Score 5     Pain Location Knee    Pain Orientation Left    Pain Descriptors / Indicators Aching;Sore                               [facility] Adult PT Treatment/Exercise - 12/07/20 0001       Knee/Hip Exercises: Plyometrics   Unilateral Jumping Other (comment)   3x5 with lowering into mini squat on L   Other Plyometric Exercises Lateral zags in ladder x3 laps    Other Plyometric Exercises side high steps ladder drill x3  laps      Knee/Hip Exercises: Standing   Functional Squat Other (comment)   SL TRX Squat 3x5   Other Standing Knee Exercises Dribbling soccer ball through yellow barriers    Other Standing Knee Exercises hip thrusts in tall kneeling on Airex pad 30# cable 3x8                    PT Education - 12/07/20 1747     Education Details Pt educated on proper form with newly added exercises.    Person(s) Educated Patient    Methods Explanation;Demonstration;Verbal cues;Handout    Comprehension Verbalized understanding;Returned demonstration;Verbal cues required              PT Short Term Goals - 11/11/20 1941       PT SHORT TERM GOAL #1   Title [name] will be >75% HEP compliant within 3 weeks to improve carryover between sessions and facilitate independent management of condition.    Status Achieved      PT SHORT TERM GOAL #2   Title [name] will be compliant with post op precautions throughout therapy.    Status Achieved  PT Long Term Goals - 10/31/20 1208       PT LONG TERM GOAL #1   Title [name] will be able to stand for >30'' in SLS stance, to show a significant improvement in balance in order to reduce fall risk  EVAL: unable      PT LONG TERM GOAL #2   Title Macoy Rodwell will be able to perform squat to 90 degrees, not limited by pain  EVAL: not able    Target Date 12/26/20      PT LONG TERM GOAL #3   Title [name] will achieve 0-120 degrees knee ROM   EVAL: 7-81 degrees    Target Date 12/26/20                   Plan - 12/07/20 1748     Clinical Impression Statement Pt responded well to all interventions today, demonstrating proper form and no increase in pain with all new exercises. He reports that he enjoys the new exercises and ladder drills and feels like they present a good challenge. He will continue to benefit from skilled PT to address his primary impairments  and return to his prior level of function without limitation.    Examination-Activity Limitations Squat;Transfers;Stairs;Lift;Carry    Examination-Participation Restrictions Community Activity    Stability/Clinical Decision Making Stable/Uncomplicated    Clinical Decision Making Low    Rehab Potential Excellent    PT Frequency 2x / week    PT Duration 8 weeks    PT Treatment/Interventions ADLs/Self Care Home Management;Aquatic Therapy;Gait training;Therapeutic activities;Therapeutic exercise;Neuromuscular re-education;Manual techniques;Dry needling    PT Next Visit Plan Continue with quad strengthening and flex ROM.  Hip strength and knee stability, balance and muscle endurance. Progress agility in controlled range/speed    PT Home Exercise Plan [id number]    Consulted and Agree with Plan of Care Patient             Patient will benefit from skilled therapeutic intervention in order to improve the following deficits and impairments:  Abnormal gait, Decreased endurance, Decreased strength, Pain, Decreased balance, Decreased mobility  Visit Diagnosis: Acute pain of left knee  Muscle weakness  Balance problem  Other abnormalities of gait and mobility     Problem List Patient Active Problem List   Diagnosis Date Noted   Acute lateral meniscus tear of left knee    Acute medial meniscal tear, left, subsequent encounter    Left ACL tear 06/09/2020   Pain in left knee 04/29/2020   Pain in left foot 07/07/2016    Carmelina Dane, PT, DPT 12/07/20 5:52 PM   [facility] [address] [place], [place], [address] Phone: [phone]   Fax:  [phone]  Name: Elliott Lasecki MRN: [id number] Date of Birth: 2000/08/20

## 2020-12-07 NOTE — Patient Instructions (Signed)
  [id number]

## 2020-12-09 ENCOUNTER — Other Ambulatory Visit: Payer: Self-pay

## 2020-12-09 ENCOUNTER — Telehealth: Payer: Self-pay

## 2020-12-09 ENCOUNTER — Ambulatory Visit: Payer: Medicaid Other

## 2020-12-09 DIAGNOSIS — R2689 Other abnormalities of gait and mobility: Secondary | ICD-10-CM

## 2020-12-09 DIAGNOSIS — M25562 Pain in left knee: Secondary | ICD-10-CM

## 2020-12-09 DIAGNOSIS — M6281 Muscle weakness (generalized): Secondary | ICD-10-CM

## 2020-12-09 NOTE — Therapy (Signed)
[facility] Outpatient Rehabilitation [facility] [address] [place], [place], [address] Phone: [phone]   Fax:  [phone]  Physical Therapy Treatment  Patient Details  Name: Jose Dennis MRN: [id number] Date of Birth: Sep 15, 2000 Referring Provider (PT): Jose Dennis Jose Mckusick, MD   Encounter Date: 12/09/2020   PT End of Session - 12/09/20 1710     Visit Number 12    Number of Visits 17    Date for PT Re-Evaluation 12/26/20    Authorization Type UHC MCD 27 visits    Authorization - Number of Visits 27    PT Start Time 1710   pt arrived 10 minutes late to his appointment   PT Stop Time 1745    PT Time Calculation (min) 35 min    Activity Tolerance Patient tolerated treatment well;No increased pain    Behavior During Therapy [facility] for tasks assessed/performed             Past Medical History:  Diagnosis Date   Medical history non-contributory     Past Surgical History:  Procedure Laterality Date   ANTERIOR CRUCIATE LIGAMENT REPAIR Left 09/24/2020   Procedure: LEFT KNEE ARTHROSCOPY, ACL RECONSTRUCTION WITH QUAD AUTOGRAFT;  Surgeon: Jose Copa, MD;  Location: [facility] OR;  Service: Orthopedics;  Laterality: Left;   NO PAST SURGERIES      There were no vitals filed for this visit.   Subjective Assessment - 12/09/20 1710     Subjective Pt reports continued mild swelling at his left medial knee, although he reports no pain today. He says he did not have residual soreness following his last treatment session and has been adherent with his HEP. He reports being ready to initiate the treatment session today.    Currently in Pain? No/denies    Pain Score 0-No pain                               [facility] Adult PT Treatment/Exercise - 12/09/20 0001       Knee/Hip Exercises: Plyometrics   Bilateral Jumping Other (comment)   drop vertical jumps of 8" box 2x10   Unilateral Jumping Other (comment)   SL vertical skip jumps 3x20      Knee/Hip Exercises: Standing   Functional Squat Other (comment)   full depth SL squat with UE support and chair for biofeedback 2x10   Other Standing Knee Exercises Side shuffles with green pull-up band resistance                    PT Education - 12/09/20 1740     Education Details Pt educated on proper form with newly added exercises.    Person(s) Educated Patient    Methods Explanation;Demonstration;Handout;Verbal cues    Comprehension Verbalized understanding;Returned demonstration;Verbal cues required              PT Short Term Goals - 11/11/20 1941       PT SHORT TERM GOAL #1   Title Jose Dennis will be >75% HEP compliant within 3 weeks to improve carryover between sessions and facilitate independent management of condition.    Status Achieved      PT SHORT TERM GOAL #2   Title Jose Dennis will be compliant with post op precautions throughout therapy.    Status Achieved               PT Long Term Goals - 10/31/20 1208  PT LONG TERM GOAL #1   Title Jose Dennis will be able to stand for >30'' in SLS stance, to show a significant improvement in balance in order to reduce fall risk  EVAL: unable      PT LONG TERM GOAL #2   Title Jose Dennis will be able to perform squat to 90 degrees, not limited by pain  EVAL: not able    Target Date 12/26/20      PT LONG TERM GOAL #3   Title Jose Dennis will achieve 0-120 degrees knee ROM   EVAL: 7-81 degrees    Target Date 12/26/20                   Plan - 12/09/20 1741     Clinical Impression Statement Pt arrived 10 minutes late to his appointment, leading to a truncated therapy session today. He responded well to all interventions today, demonstrating proper form and no increase in pain with all new exercises. He reports that he enjoys the new exercises and ladder drills and feels like they present a good challenge. He will continue to benefit from  skilled PT to address his primary impairments and return to his prior level of function without limitation.    Examination-Activity Limitations Squat;Transfers;Stairs;Lift;Carry    Examination-Participation Restrictions Community Activity    Stability/Clinical Decision Making Stable/Uncomplicated    Clinical Decision Making Low    Rehab Potential Excellent    PT Frequency 2x / week    PT Duration 8 weeks    PT Treatment/Interventions ADLs/Self Care Home Management;Aquatic Therapy;Gait training;Therapeutic activities;Therapeutic exercise;Neuromuscular re-education;Manual techniques;Dry needling    PT Next Visit Plan Continue with quad strengthening and flex ROM.  Hip strength and knee stability, balance and muscle endurance. Progress agility in controlled range/speed, utilizing [facility] ACL reconstruction rehab protocol    PT Home Exercise Plan [id number]    Consulted and Agree with Plan of Care Patient             Patient will benefit from skilled therapeutic intervention in order to improve the following deficits and impairments:  Abnormal gait, Decreased endurance, Decreased strength, Pain, Decreased balance, Decreased mobility  Visit Diagnosis: Acute pain of left knee  Muscle weakness  Balance problem  Other abnormalities of gait and mobility     Problem List Patient Active Problem List   Diagnosis Date Noted   Acute lateral meniscus tear of left knee    Acute medial meniscal tear, left, subsequent encounter    Left ACL tear 06/09/2020   Pain in left knee 04/29/2020   Pain in left foot 07/07/2016    Jose Dennis, PT, DPT 12/09/20 5:43 PM   [facility] Outpatient Rehabilitation [facility] [address] [place], [place], [address] Phone: [phone]   Fax:  [phone]  Name: Jose Dennis MRN: [id number] Date of Birth: 2001-04-14

## 2020-12-09 NOTE — Patient Instructions (Signed)
  [id number] 

## 2020-12-09 NOTE — Telephone Encounter (Signed)
Spoke with pt about his missed appointment at 4:15. Due to the PT having a 5:00 opening, his appointment was moved to 5:00. This will not count as a no-show.

## 2020-12-15 ENCOUNTER — Ambulatory Visit: Payer: Medicaid Other

## 2020-12-15 ENCOUNTER — Other Ambulatory Visit: Payer: Self-pay

## 2020-12-15 DIAGNOSIS — M25562 Pain in left knee: Secondary | ICD-10-CM | POA: Diagnosis not present

## 2020-12-15 DIAGNOSIS — R2689 Other abnormalities of gait and mobility: Secondary | ICD-10-CM

## 2020-12-15 DIAGNOSIS — M6281 Muscle weakness (generalized): Secondary | ICD-10-CM

## 2020-12-15 NOTE — Patient Instructions (Signed)
Reviewed pt's HEP, instructed on proper form.

## 2020-12-15 NOTE — Therapy (Signed)
[facility] Outpatient Rehabilitation [facility] [address] [place], [place], [address] Phone: [phone]   Fax:  [phone]  Physical Therapy Treatment  Patient Details  Name: Jose Dennis MRN: [id number] Date of Birth: 2001/03/23 Referring Provider (PT): August Saucer Corrie Mckusick, MD   Encounter Date: 12/15/2020   PT End of Session - 12/15/20 1752     Visit Number 13    Number of Visits 17    Date for PT Re-Evaluation 12/26/20    Authorization Type UHC MCD 27 visits    Authorization - Number of Visits 27    PT Start Time 1755   pt arrived 10 minutes late to his appointment   PT Stop Time 1830    PT Time Calculation (min) 35 min    Activity Tolerance Patient tolerated treatment well;No increased pain    Behavior During Therapy [facility] for tasks assessed/performed             Past Medical History:  Diagnosis Date   Medical history non-contributory     Past Surgical History:  Procedure Laterality Date   ANTERIOR CRUCIATE LIGAMENT REPAIR Left 09/24/2020   Procedure: LEFT KNEE ARTHROSCOPY, ACL RECONSTRUCTION WITH QUAD AUTOGRAFT;  Surgeon: Cammy Copa, MD;  Location: [facility] OR;  Service: Orthopedics;  Laterality: Left;   NO PAST SURGERIES      There were no vitals filed for this visit.   Subjective Assessment - 12/15/20 1753     Subjective Pt reports that his knee feels the same since his last visit with no increase in pain or stiffness. He adds that he has been adherent with his HEP, performing his exercises daily.    Currently in Pain? No/denies    Pain Score 0-No pain                               [facility] Adult PT Treatment/Exercise - 12/15/20 0001       Knee/Hip Exercises: Stretches   Passive Hamstring Stretch Other (comment)   x86min hold on L     Knee/Hip Exercises: Plyometrics   Other Plyometric Exercises scissor steps in ladder 2x3 laps    Other Plyometric Exercises 2 in, 1 out ladder drill 2x3 laps      Knee/Hip  Exercises: Standing   Functional Squat Other (comment)   Split squat with R foot on 8" box 3x8   Other Standing Knee Exercises Squat hops 3x8    Other Standing Knee Exercises L SL rebounder on Airex pad with 2kg ball forward, 1kg ball each side                    PT Education - 12/15/20 1821     Education Details Pt educated on proper form with in-session exercises.    Person(s) Educated Patient    Methods Explanation;Demonstration;Verbal cues    Comprehension Verbalized understanding;Returned demonstration;Verbal cues required              PT Short Term Goals - 11/11/20 1941       PT SHORT TERM GOAL #1   Title Jose Dennis will be >75% HEP compliant within 3 weeks to improve carryover between sessions and facilitate independent management of condition.    Status Achieved      PT SHORT TERM GOAL #2   Title Jose Dennis will be compliant with post op precautions throughout therapy.    Status Achieved  PT Long Term Goals - 10/31/20 1208       PT LONG TERM GOAL #1   Title Jose Dennis will be able to stand for >30'' in SLS stance, to show a significant improvement in balance in order to reduce fall risk  EVAL: unable      PT LONG TERM GOAL #2   Title Jose Dennis will be able to perform squat to 90 degrees, not limited by pain  EVAL: not able    Target Date 12/26/20      PT LONG TERM GOAL #3   Title Jose Dennis will achieve 0-120 degrees knee ROM   EVAL: 7-81 degrees    Target Date 12/26/20                   Plan - 12/15/20 1822     Clinical Impression Statement Pt arrived 10 minutes late to his appointment, leading to a truncated therapy session today. He responded well to all interventions today, demonstrating proper form and no increase in pain with all new exercises. He will continue to benefit from skilled PT to address his primary impairments and return to his prior level of function  without limitation.    Examination-Activity Limitations Squat;Transfers;Stairs;Lift;Carry    Examination-Participation Restrictions Community Activity    Stability/Clinical Decision Making Stable/Uncomplicated    Clinical Decision Making Low    Rehab Potential Excellent    PT Frequency 2x / week    PT Duration 8 weeks    PT Treatment/Interventions ADLs/Self Care Home Management;Aquatic Therapy;Gait training;Therapeutic activities;Therapeutic exercise;Neuromuscular re-education;Manual techniques;Dry needling    PT Next Visit Plan Continue with quad strengthening and flex ROM.  Hip strength and knee stability, balance and muscle endurance. Progress agility in controlled range/speed, utilizing [facility] ACL reconstruction rehab protocol    PT Home Exercise Plan [id number]    Consulted and Agree with Plan of Care Patient             Patient will benefit from skilled therapeutic intervention in order to improve the following deficits and impairments:  Abnormal gait, Decreased endurance, Decreased strength, Pain, Decreased balance, Decreased mobility  Visit Diagnosis: Acute pain of left knee  Muscle weakness  Balance problem  Other abnormalities of gait and mobility     Problem List Patient Active Problem List   Diagnosis Date Noted   Acute lateral meniscus tear of left knee    Acute medial meniscal tear, left, subsequent encounter    Left ACL tear 06/09/2020   Pain in left knee 04/29/2020   Pain in left foot 07/07/2016    Carmelina Dane, PT, DPT 12/15/20 6:27 PM   [facility] Outpatient Rehabilitation [facility] [address] [place], [place], [address] Phone: [phone]   Fax:  [phone]  Name: Jose Dennis MRN: [id number] Date of Birth: 18-Aug-2000

## 2020-12-16 ENCOUNTER — Ambulatory Visit: Payer: Medicaid Other

## 2020-12-16 DIAGNOSIS — M25562 Pain in left knee: Secondary | ICD-10-CM

## 2020-12-16 DIAGNOSIS — R2689 Other abnormalities of gait and mobility: Secondary | ICD-10-CM

## 2020-12-16 DIAGNOSIS — M6281 Muscle weakness (generalized): Secondary | ICD-10-CM

## 2020-12-16 NOTE — Patient Instructions (Signed)
  [id number] 

## 2020-12-16 NOTE — Therapy (Signed)
[facility] [address] [place], [place], [address] Phone: [phone]   Fax:  [phone]  Physical Therapy Treatment  Patient Details  Name: Jose Dennis MRN: [id number] Date of Birth: 12/18/2000 Referring Provider (PT): August Saucer Corrie Mckusick, MD   Encounter Date: 12/16/2020   PT End of Session - 12/16/20 1434     Visit Number 14    Number of Visits 17    Date for PT Re-Evaluation 12/26/20    Authorization Type U[employer] MCD 27 visits    Authorization - Number of Visits 27    PT Start Time 1400    PT Stop Time 1445    PT Time Calculation (min) 45 min    Activity Tolerance Patient tolerated treatment well;No increased pain    Behavior During Therapy [facility] for tasks assessed/performed             Past Medical History:  Diagnosis Date   Medical history non-contributory     Past Surgical History:  Procedure Laterality Date   ANTERIOR CRUCIATE LIGAMENT REPAIR Left 09/24/2020   Procedure: LEFT KNEE ARTHROSCOPY, ACL RECONSTRUCTION WITH QUAD AUTOGRAFT;  Surgeon: Cammy Copa, MD;  Location: [facility] OR;  Service: Orthopedics;  Laterality: Left;   NO PAST SURGERIES      There were no vitals filed for this visit.   Subjective Assessment - 12/16/20 1404     Subjective Pt reports no pain and minor stiffness in his L knee today. He reports continued adherence to his HEP and reports that he can feel his exercises are working. He reports being ready to initiate his treatment session today.    Currently in Pain? No/denies    Pain Score 0-No pain                               [facility] Adult PT Treatment/Exercise - 12/16/20 0001       Knee/Hip Exercises: Machines for Strengthening   Cybex Leg Press 3x10 140#    Other Machine Knee extension machine 15# 2x10 with 5-sec eccentric lowering      Knee/Hip Exercises: Standing   Walking with Sports Cord Side shuffles with green cord 3x6ft laps    Other Standing  Knee Exercises Cybex hip abduction and extension 2x10 BIL 37.5#    Other Standing Knee Exercises L SL dead lift with 25# KB 3x10                    PT Education - 12/16/20 1434     Education Details Pt educated on proper form with new exercises.    Person(s) Educated Patient    Methods Explanation;Demonstration;Handout    Comprehension Verbalized understanding;Returned demonstration              PT Short Term Goals - 11/11/20 1941       PT SHORT TERM GOAL #1   Title [name] will be >75% HEP compliant within 3 weeks to improve carryover between sessions and facilitate independent management of condition.    Status Achieved      PT SHORT TERM GOAL #2   Title [name] will be compliant with post op precautions throughout therapy.    Status Achieved               PT Long Term Goals - 10/31/20 1208       PT LONG TERM GOAL #1   Title [name] will be able  to stand for >30'' in SLS stance, to show a significant improvement in balance in order to reduce fall risk  EVAL: unable      PT LONG TERM GOAL #2   Title [name] will be able to perform squat to 90 degrees, not limited by pain  EVAL: not able    Target Date 12/26/20      PT LONG TERM GOAL #3   Title [name] will achieve 0-120 degrees knee ROM   EVAL: 7-81 degrees    Target Date 12/26/20                   Plan - 12/16/20 1437     Clinical Impression Statement Pt responded well to all interventions today, demonstrating proper form and no increase in pain with all new exercises. He will continue to benefit from skilled PT to address his primary impairments and return to his prior level of function without limitation.    Examination-Activity Limitations Squat;Transfers;Stairs;Lift;Carry    Examination-Participation Restrictions Community Activity    Stability/Clinical Decision Making Stable/Uncomplicated    Clinical Decision Making  Low    Rehab Potential Excellent    PT Frequency 2x / week    PT Duration 8 weeks    PT Treatment/Interventions ADLs/Self Care Home Management;Aquatic Therapy;Gait training;Therapeutic activities;Therapeutic exercise;Neuromuscular re-education;Manual techniques;Dry needling    PT Next Visit Plan Continue with quad strengthening and flex ROM.  Hip strength and knee stability, balance and muscle endurance. Progress agility in controlled range/speed, utilizing [facility] ACL reconstruction rehab protocol    PT Home Exercise Plan [id number]    Consulted and Agree with Plan of Care Patient             Patient will benefit from skilled therapeutic intervention in order to improve the following deficits and impairments:  Abnormal gait, Decreased endurance, Decreased strength, Pain, Decreased balance, Decreased mobility  Visit Diagnosis: Acute pain of left knee  Muscle weakness  Balance problem  Other abnormalities of gait and mobility     Problem List Patient Active Problem List   Diagnosis Date Noted   Acute lateral meniscus tear of left knee    Acute medial meniscal tear, left, subsequent encounter    Left ACL tear 06/09/2020   Pain in left knee 04/29/2020   Pain in left foot 07/07/2016    Carmelina Dane, PT, DPT 12/16/20 2:40 PM   [name] [address] [place], [place], [address] Phone: [phone]   Fax:  [phone]  Name: Jose Dennis MRN: [id number] Date of Birth: June 30, 2000

## 2020-12-22 ENCOUNTER — Ambulatory Visit: Payer: Medicaid Other

## 2020-12-22 ENCOUNTER — Other Ambulatory Visit: Payer: Self-pay

## 2020-12-22 DIAGNOSIS — M6281 Muscle weakness (generalized): Secondary | ICD-10-CM

## 2020-12-22 DIAGNOSIS — M25562 Pain in left knee: Secondary | ICD-10-CM | POA: Diagnosis not present

## 2020-12-22 DIAGNOSIS — R2689 Other abnormalities of gait and mobility: Secondary | ICD-10-CM

## 2020-12-22 NOTE — Patient Instructions (Signed)
Pt instructed to begin a return-to-run program, increasing jogging increments by 2 minutes each week. I.e. Week 1: walk x65min, run x5 min; week 2: walk x45min, run x7 min, etc.

## 2020-12-22 NOTE — Therapy (Signed)
[facility] [address] [place], [place], [address] Phone: [phone]   Fax:  [phone]  Physical Therapy Treatment  Patient Details  Name: Jose Dennis MRN: [id number] Date of Birth: [birth year] Referring Provider (PT): Jose Dennis Jose Mckusick, MD   Encounter Date: 12/22/2020   PT End of Session - 12/22/20 1814     Visit Number 15    Number of Visits 17    Date for PT Re-Evaluation 12/26/20    Authorization Type UHC MCD 27 visits    Authorization - Number of Visits 27    PT Start Time 1750    PT Stop Time 1830    PT Time Calculation (min) 40 min    Activity Tolerance Patient tolerated treatment well;No increased pain    Behavior During Therapy [facility] for tasks assessed/performed             Past Medical History:  Diagnosis Date   Medical history non-contributory     Past Surgical History:  Procedure Laterality Date   ANTERIOR CRUCIATE LIGAMENT REPAIR Left 09/24/2020   Procedure: LEFT KNEE ARTHROSCOPY, ACL RECONSTRUCTION WITH QUAD AUTOGRAFT;  Surgeon: Cammy Copa, MD;  Location: [facility] OR;  Service: Orthopedics;  Laterality: Left;   NO PAST SURGERIES      There were no vitals filed for this visit.   Subjective Assessment - 12/22/20 1750     Subjective Pt reports no pain and minor stiffness in his L knee today. He reports continued adherence to his HEP and reports that he can feel his exercises are working. He reports being ready to initiate his treatment session today. 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    Currently in Pain? No/denies    Pain Score 0-No pain                               [facility] Adult PT Treatment/Exercise - 12/22/20 0001       Knee/Hip Exercises: Aerobic   Tread Mill x2 minute walk at , x15min jog at , x2 min walk at 2.       Knee/Hip Exercises: Machines for Strengthening   Other Machine Quick heel raises 3x20 on Cybex leg press machine 100#      Knee/Hip Exercises: Plyometrics   Unilateral Jumping Other (comment)   SL forward jumping through ladded 2x3laps   Other Plyometric Exercises 2 in, 2 out forward hop ladder drill 2x3laps    Other Plyometric Exercises 2 in, 1 out lateral cut ladder drill 2x3 laps      Knee/Hip Exercises: Standing   Rebounder SL stance on Airex pad with 2kg ball into rebounder 3x15 BIL    Other Standing Knee Exercises From seated position with 4" box under R foot, sit-to-stand with L SL hop at top of stand 2x8    Other Standing Knee Exercises L SL dead lift with 25# KB 3x10                    PT Education - 12/22/20 1814     Education Details Pt educated on how to progress his HEP independently by resistance and repetitions.    Person(s) Educated Patient    Methods Explanation;Demonstration;Verbal cues    Comprehension Verbalized understanding;Returned demonstration;Verbal cues required  PT Short Term Goals - 11/11/20 1941       PT SHORT TERM GOAL #1   Title Jose Dennis will be >75% HEP compliant within 3 weeks to improve carryover between sessions and facilitate independent management of condition.    Status Achieved      PT SHORT TERM GOAL #2   Title Jose Dennis will be compliant with post op precautions throughout therapy.    Status Achieved               PT Long Term Goals - 10/31/20 1208       PT LONG TERM GOAL #1   Title Jose Dennis will be able to stand for >30'' in SLS stance, to show a significant improvement in balance in order to reduce fall risk  EVAL: unable      PT LONG TERM GOAL #2   Title Jose Dennis will be able to perform squat to 90 degrees, not limited by pain  EVAL: not able    Target Date 12/26/20      PT LONG TERM GOAL #3   Title Jose Dennis will achieve 0-120 degrees  knee ROM   EVAL: 7-81 degrees    Target Date 12/26/20                   Plan - 12/22/20 1815     Clinical Impression Statement Pt responded well to all interventions today, demonstrating proper form and no increase in pain with all new exercises. He shows improvement with form with agility ladder drills. Due to him moving at the start of next week, the pt will be discharged at his next appointment to prepare for transition of care closer to [place], [place]. He will continue to benefit from skilled PT to address his primary impairments and return to his prior level of function without limitation.    Examination-Activity Limitations Squat;Transfers;Stairs;Lift;Carry    Examination-Participation Restrictions Community Activity    Stability/Clinical Decision Making Stable/Uncomplicated    Clinical Decision Making Low    Rehab Potential Excellent    PT Frequency 2x / week    PT Duration 8 weeks    PT Treatment/Interventions ADLs/Self Care Home Management;Aquatic Therapy;Gait training;Therapeutic activities;Therapeutic exercise;Neuromuscular re-education;Manual techniques;Dry needling    PT Next Visit Plan Re-assess goals/ objective measures, D/C to transition care closer to [place], [place]    PT Home Exercise Plan [id number]    Consulted and Agree with Plan of Care Patient             Patient will benefit from skilled therapeutic intervention in order to improve the following deficits and impairments:  Abnormal gait, Decreased endurance, Decreased strength, Pain, Decreased balance, Decreased mobility  Visit Diagnosis: Acute pain of left knee  Muscle weakness  Balance problem  Other abnormalities of gait and mobility     Problem List Patient Active Problem List   Diagnosis Date Noted   Acute lateral meniscus tear of left knee    Acute medial meniscal tear, left, subsequent encounter    Left ACL tear 06/09/2020   Pain in left knee 04/29/2020   Pain in left foot 07/07/2016     Carmelina Dane, PT, DPT 12/22/20 6:25 PM   [facility] [address] [place], [place], [address] Phone: [phone]   Fax:  [phone]  Name: Jose Dennis MRN: [id number] Date of Birth: September 03, 2000

## 2020-12-24 ENCOUNTER — Ambulatory Visit: Payer: Medicaid Other

## 2020-12-24 ENCOUNTER — Other Ambulatory Visit: Payer: Self-pay

## 2020-12-24 DIAGNOSIS — R2689 Other abnormalities of gait and mobility: Secondary | ICD-10-CM

## 2020-12-24 DIAGNOSIS — M25562 Pain in left knee: Secondary | ICD-10-CM

## 2020-12-24 DIAGNOSIS — M6281 Muscle weakness (generalized): Secondary | ICD-10-CM

## 2020-12-24 NOTE — Therapy (Signed)
[place], [place], [address] Phone: [phone]   Fax:  [phone]  Physical Therapy Treatment/ Discharge Summary  Patient Details  Name: Jose Dennis MRN: [id number] Date of Birth: May 24, 2000 Referring Provider (PT): Marlou Sa Tonna Corner, MD   Encounter Date: 12/24/2020   PT End of Session - 12/24/20 1850     Visit Number 16    Number of Visits 17    Date for PT Re-Evaluation 12/26/20    Authorization Type UHC MCD 27 visits    Authorization - Number of Visits 27    PT Start Time 3568    PT Stop Time 1855    PT Time Calculation (min) 25 min    Activity Tolerance Patient tolerated treatment well;No increased pain    Behavior During Therapy [facility] for tasks assessed/performed             Past Medical History:  Diagnosis Date   Medical history non-contributory     Past Surgical History:  Procedure Laterality Date   ANTERIOR CRUCIATE LIGAMENT REPAIR Left 09/24/2020   Procedure: LEFT KNEE ARTHROSCOPY, ACL RECONSTRUCTION WITH QUAD AUTOGRAFT;  Surgeon: Meredith Pel, MD;  Location: [facility];  Service: Orthopedics;  Laterality: Left;   NO PAST SURGERIES      There were no vitals filed for this visit.   Subjective Assessment - 12/24/20 1835     Subjective Pt reports no pain today and states he has been consistent with his HEP. He reports being ready to be discharged from PT [address] in order to transfer care to a PT clinic in [place], [place] where he is moving this weekend.    Currently in Pain? No/denies    Pain Score 0-No pain                [facility] PT Assessment - 12/24/20 0001       Functional Tests   Functional tests Squat;Single leg stance      Squat   Comments WNL, knee flexion>100 degrees, no pain      Single Leg Stance   Comments x30sec BIL, no errors      AROM   Left Knee Extension 0    Left Knee Flexion 126      PROM   Left Knee Extension 0    Left Knee  Flexion 126                                   PT Education - 12/24/20 1849     Education Details Pt educated on how to progress his HEP.    Person(s) Educated Patient    Methods Explanation    Comprehension Verbalized understanding              PT Short Term Goals - 11/11/20 1941       PT SHORT TERM GOAL #1   Title Burr Medico will be >75% HEP compliant within 3 weeks to improve carryover between sessions and facilitate independent management of condition.    Status Achieved      PT SHORT TERM GOAL #2   Title Burr Medico will be compliant with post op precautions throughout therapy.    Status Achieved               PT Long Term Goals - 12/24/20 1846       PT LONG TERM GOAL #1   Title  Malacki Mcphearson will be able to stand for >30'' in SLS stance, to show a significant improvement in balance in order to reduce fall risk  EVAL: unable    Baseline Pt achieved 30 seconds SLS BIL with no errors    Status Achieved      PT LONG TERM GOAL #2   Title Burr Medico will be able to perform squat to 90 degrees, not limited by pain  EVAL: not able    Baseline Pt achieved squat with knee flexion >100 degrees and no pain    Status Achieved      PT LONG TERM GOAL #3   Title Burr Medico will achieve 0-120 degrees knee ROM   EVAL: 7-81 degrees    Baseline pt achieved R knee AROM of 0-126 degrees    Status Achieved                   Plan - 12/24/20 1852     Clinical Impression Statement Upon re-assessment, the pt has met all of his goals from PT at [facility]. Due to this and the fact that the patient is moving to [place] this weekend, he is discharged from PT at [facility]. Due to his personal goals of returning to play soccer, he was instructed to establish care at a new PT clinic in [place] to work on advanced agility training to prepare to get back on the soccer field.    Examination-Activity  Limitations Squat;Transfers;Stairs;Lift;Carry    Examination-Participation Restrictions Community Activity    Stability/Clinical Decision Making Stable/Uncomplicated    Clinical Decision Making Low    PT Next Visit Plan Pt is discharged from PT    PT Home Exercise Plan [id number]    Consulted and Agree with Plan of Care Patient             Patient will benefit from skilled therapeutic intervention in order to improve the following deficits and impairments:  Abnormal gait, Decreased endurance, Decreased strength, Pain, Decreased balance, Decreased mobility  Visit Diagnosis: Acute pain of left knee  Muscle weakness  Balance problem  Other abnormalities of gait and mobility     Problem List Patient Active Problem List   Diagnosis Date Noted   Acute lateral meniscus tear of left knee    Acute medial meniscal tear, left, subsequent encounter    Left ACL tear 06/09/2020   Pain in left knee 04/29/2020   Pain in left foot 07/07/2016    Vanessa [place], PT, DPT 12/24/20 7:01 PM   [facility] [address] [place], [place], [address] Phone: [phone]   Fax:  [phone]  Name: Jose Dennis MRN: [id number] Date of Birth: November 23, 2000

## 2020-12-24 NOTE — Patient Instructions (Signed)
Pt instructed to establish care at a new PT clinic in [place] and to request a release of information from [employer]. Also instructed to progress HEP as able.

## 2020-12-29 ENCOUNTER — Telehealth: Payer: Self-pay | Admitting: Orthopedic Surgery

## 2020-12-29 DIAGNOSIS — Z9889 Other specified postprocedural states: Secondary | ICD-10-CM

## 2020-12-29 NOTE — Addendum Note (Signed)
Addended byPrescott Parma on: 12/29/2020 03:05 PM   Modules accepted: Orders

## 2020-12-29 NOTE — Telephone Encounter (Signed)
Referral entered  

## 2020-12-29 NOTE — Telephone Encounter (Signed)
Pt mother called and wants to get a referral to [facility] [place] for her son because he is going back to school at [employer].  [phone] -- [facility]  CB [phone]

## 2021-01-07 ENCOUNTER — Encounter: Payer: Medicaid Other | Admitting: Orthopedic Surgery

## 2021-01-24 NOTE — Progress Notes (Signed)
Erroneous encounter

## 2021-01-29 ENCOUNTER — Encounter: Payer: Medicaid Other | Admitting: Family

## 2021-01-29 DIAGNOSIS — Z114 Encounter for screening for human immunodeficiency virus [HIV]: Secondary | ICD-10-CM

## 2021-01-29 DIAGNOSIS — Z1329 Encounter for screening for other suspected endocrine disorder: Secondary | ICD-10-CM

## 2021-01-29 DIAGNOSIS — Z0000 Encounter for general adult medical examination without abnormal findings: Secondary | ICD-10-CM

## 2021-01-29 DIAGNOSIS — Z1159 Encounter for screening for other viral diseases: Secondary | ICD-10-CM

## 2021-01-29 DIAGNOSIS — Z130 Encounter for screening for diseases of the blood and blood-forming organs and certain disorders involving the immune mechanism: Secondary | ICD-10-CM

## 2021-01-29 DIAGNOSIS — Z13220 Encounter for screening for lipoid disorders: Secondary | ICD-10-CM

## 2021-01-29 DIAGNOSIS — Z131 Encounter for screening for diabetes mellitus: Secondary | ICD-10-CM

## 2021-01-29 DIAGNOSIS — Z13228 Encounter for screening for other metabolic disorders: Secondary | ICD-10-CM

## 2021-05-21 ENCOUNTER — Other Ambulatory Visit: Payer: Self-pay

## 2021-05-21 ENCOUNTER — Ambulatory Visit (INDEPENDENT_AMBULATORY_CARE_PROVIDER_SITE_OTHER): Payer: Medicaid Other | Admitting: Surgical

## 2021-05-21 ENCOUNTER — Encounter: Payer: Self-pay | Admitting: Surgical

## 2021-05-21 DIAGNOSIS — Z9889 Other specified postprocedural states: Secondary | ICD-10-CM | POA: Diagnosis not present

## 2021-05-21 NOTE — Progress Notes (Signed)
Office Visit Note   Patient: Jose Dennis           Date of Birth: 2000/06/13           MRN: [id number] Visit Date: 05/21/2021 Requested by: No referring provider defined for this encounter. PCP: Pcp, No  Subjective: Chief Complaint  Patient presents with   Left Knee - Follow-up    HPI: Jose Dennis is a 21 y.o. male who presents to the office s/p left knee anterior cruciate ligament reconstruction with inside-out medial meniscal repair and partial lateral meniscectomy on 09/24/2020.  He is doing well aside from slight soreness after an intense workout.  Not currently taking anything for pain control.  Denies any consistent mechanical symptoms or instability of the knee; only feels the knee gives way on him after a very long workout.  No increase in pain or swelling over the last several months since increasing his activity.  He is a Consulting civil engineer at [employer] and has been going to physical therapy in [place] up until December.  He is planning on returning once he goes back to school.  He has the ultimate goal of returning to playing soccer and he has been running around and passing the ball with some friends but not return to actually playing in a game situation yet..                ROS: All systems reviewed are negative as they relate to the chief complaint within the history of present illness.  Patient denies fevers or chills.  Assessment & Plan: Visit Diagnoses:  1. S/P ACL reconstruction   2. S/P medial meniscal repair     Plan: Patient is a 21 year old male who presents for reevaluation following left knee anterior cruciate ligament reconstruction with quadricep autograft and medial meniscal inside-out repair and lateral meniscectomy on 09/24/2020.  Overall he is doing very well about 8 months out from procedure.  No significant instability or mechanical symptoms though he does feel like the knee is not as strong after a very hard workout.  He is continue with physical  therapy and has not returned to sporting activities yet.  I did caution him against returning to playing in even pickup games just yet.  He is okay to continue with his current activities of jogging, cutting/pivoting, therapy exercises, weightlifting.  In about a month which will be 9 months out, he can start some ball drills with a soccer ball and more sport specific exercises.  Follow-up in 2 months for clinical recheck at 10 months out from surgery with Dr. August Saucer; at that point anticipate him easing back into full sporting activities.  Patient agreed with this plan.  Follow-Up Instructions: No follow-ups on file.   Orders:  No orders of the defined types were placed in this encounter.  No orders of the defined types were placed in this encounter.     Procedures: No procedures performed   Clinical Data: No additional findings.  Objective: Vital Signs: There were no vitals taken for this visit.  Physical Exam:  Constitutional: Patient appears well-developed HEENT:  Head: Normocephalic Eyes:EOM are normal Neck: Normal range of motion Cardiovascular: Normal rate Pulmonary/chest: Effort normal Neurologic: Patient is alert Skin: Skin is warm Psychiatric: Patient has normal mood and affect  Ortho Exam: Ortho exam demonstrates left knee with 0 degrees extension and greater than 120 degrees of knee flexion.  No effusion is noted.  Incisions are well-healed from prior surgery.  No calf tenderness.  Negative Homans' sign.  Able to perform straight leg raise without extensor lag.  Excellent quad strength rated 5/5.  Excellent hamstring strength rated 5/5.  ACL graft is stable on Lachman exam and by anterior drawer.  There is no lateral joint line tenderness with minimal medial joint line tenderness that patient rates 3/10.  Patient ambulates without antalgia.  Specialty Comments:  No specialty comments available.  Imaging: No results found.   PMFS History: Patient Active Problem List    Diagnosis Date Noted   Acute lateral meniscus tear of left knee    Acute medial meniscal tear, left, subsequent encounter    Left ACL tear 06/09/2020   Pain in left knee 04/29/2020   Pain in left foot 07/07/2016   Past Medical History:  Diagnosis Date   Medical history non-contributory     Family History  Family history unknown: Yes    Past Surgical History:  Procedure Laterality Date   ANTERIOR CRUCIATE LIGAMENT REPAIR Left 09/24/2020   Procedure: LEFT KNEE ARTHROSCOPY, ACL RECONSTRUCTION WITH QUAD AUTOGRAFT;  Surgeon: Cammy Copa, MD;  [facility]: [facility];  Service: Orthopedics;  Laterality: Left;   NO PAST SURGERIES     Social History   Occupational History   Not on file  Tobacco Use   Smoking status: Never   Smokeless tobacco: Never  Vaping Use   Vaping Use: Some days  Substance and Sexual Activity   Alcohol use: No   Drug use: No   Sexual activity: Not on file

## 2021-07-23 ENCOUNTER — Ambulatory Visit: Payer: Medicaid Other | Admitting: Orthopedic Surgery

## 2021-07-28 ENCOUNTER — Ambulatory Visit: Payer: Medicaid Other | Admitting: Orthopedic Surgery

## 2021-08-09 ENCOUNTER — Ambulatory Visit (INDEPENDENT_AMBULATORY_CARE_PROVIDER_SITE_OTHER): Payer: Medicaid Other | Admitting: Orthopedic Surgery

## 2021-08-09 DIAGNOSIS — Z9889 Other specified postprocedural states: Secondary | ICD-10-CM

## 2021-08-11 ENCOUNTER — Encounter: Payer: Self-pay | Admitting: Orthopedic Surgery

## 2021-08-11 NOTE — Progress Notes (Signed)
? ?Office Visit Note ?  ?Patient: Jose Dennis           ?Date of Birth: March 22, 2001           ?MRN: [id number] ?Visit Date: 08/09/2021 ?Requested by: No referring provider defined for this encounter. ?PCP: Pcp, No ? ?Subjective: ?Chief Complaint  ?Patient presents with  ? Left Knee - Follow-up  ? ? ?HPI: Jose Dennis is a 21 year old patient who underwent left knee inside-out medial meniscal repair and posterior lateral meniscectomy with ACL reconstruction using quad autograft on 09/24/2020.  He is doing well.  No waking from sleep at night with pain.  No giving way.  No medication for pain.  He has been going to the gym and doing some running.  He is studying at [employer].  Doing squats leg press single leg raise weighted lunges agility exercises and he has not reported any swelling.  Wants to resume some recreational sports. ?             ?ROS: All systems reviewed are negative as they relate to the chief complaint within the history of present illness.  Patient denies  fevers or chills. ? ? ?Assessment & Plan: ?Visit Diagnoses:  ?1. S/P ACL reconstruction   ? ? ?Plan: Impression is patient is doing well following ACL reconstruction.  Graft is stable.  No effusion today.  Has essentially full range of motion except possibly the last 5 degrees of flexion.  I told him that even though he is a Muslim and prays 5 times a day that that is likely exercise enough to work on achieving that last little bit of flexion.  Would not really want to stress that meniscal repair to a large degree with anything other than flexion that he is doing on his own.  Regarding returning to sport I think that he is at the point in his recovery that he should resume the less demanding sports first prior to resuming soccer.  I think soccer should be good after he has regained some of his muscle memory with agility doing lesser sports.  Follow-up with [place] as needed ? ?Follow-Up Instructions: Return if symptoms worsen or fail to improve.  ? ?Orders:   ?No orders of the defined types were placed in this encounter. ? ?No orders of the defined types were placed in this encounter. ? ? ? ? Procedures: ?No procedures performed ? ? ?Clinical Data: ?No additional findings. ? ?Objective: ?Vital Signs: There were no vitals taken for this visit. ? ?Physical Exam:  ? ?Constitutional: Patient appears well-developed ?HEENT:  ?Head: Normocephalic ?Eyes:EOM are normal ?Neck: Normal range of motion ?Cardiovascular: Normal rate ?Pulmonary/chest: Effort normal ?Neurologic: Patient is alert ?Skin: Skin is warm ?Psychiatric: Patient has normal mood and affect ? ? ?Ortho Exam: Ortho exam demonstrates excellent range of motion of the left knee with no effusion.  Patient has 2 mm anterior laxity with solid endpoint.  Excellent quad strength with less than a centimeter atrophy left versus right.  No anteromedial posterior medial or lateral sided joint line tenderness. ? ?Specialty Comments:  ?No specialty comments available. ? ?Imaging: ?No results found. ? ? ?PMFS History: ?Patient Active Problem List  ? Diagnosis Date Noted  ? Acute lateral meniscus tear of left knee   ? Acute medial meniscal tear, left, subsequent encounter   ? Left ACL tear 06/09/2020  ? Pain in left knee 04/29/2020  ? Pain in left foot 07/07/2016  ? ?Past Medical History:  ?Diagnosis Date  ?  Medical history non-contributory   ?  ?Family History  ?Family history unknown: Yes  ?  ?Past Surgical History:  ?Procedure Laterality Date  ? ANTERIOR CRUCIATE LIGAMENT REPAIR Left 09/24/2020  ? Procedure: LEFT KNEE ARTHROSCOPY, ACL RECONSTRUCTION WITH QUAD AUTOGRAFT;  Surgeon: Cammy Copa, MD;  Location: [facility] OR;  Service: Orthopedics;  Laterality: Left;  ? NO PAST SURGERIES    ? ?Social History  ? ?Occupational History  ? Not on file  ?Tobacco Use  ? Smoking status: Never  ? Smokeless tobacco: Never  ?Vaping Use  ? Vaping Use: Some days  ?Substance and Sexual Activity  ? Alcohol use: No  ? Drug use: No  ? Sexual  activity: Not on file  ? ? ? ? ? ?

## 2022-05-05 ENCOUNTER — Ambulatory Visit: Payer: Medicaid Other | Admitting: Physician Assistant

## 2023-01-16 ENCOUNTER — Other Ambulatory Visit (INDEPENDENT_AMBULATORY_CARE_PROVIDER_SITE_OTHER): Payer: Medicaid Other

## 2023-01-16 ENCOUNTER — Ambulatory Visit (INDEPENDENT_AMBULATORY_CARE_PROVIDER_SITE_OTHER): Payer: Medicaid Other | Admitting: Surgical

## 2023-01-16 DIAGNOSIS — M25571 Pain in right ankle and joints of right foot: Secondary | ICD-10-CM

## 2023-01-16 DIAGNOSIS — M25561 Pain in right knee: Secondary | ICD-10-CM

## 2023-01-29 ENCOUNTER — Encounter: Payer: Self-pay | Admitting: Surgical

## 2023-01-29 NOTE — Progress Notes (Signed)
Office Visit Note   Patient: Jose Dennis           Date of Birth: 08/16/2000           MRN: [id number] Visit Date: 01/16/2023 Requested by: No referring provider defined for this encounter. PCP: Pcp, No  Subjective: Chief Complaint  Patient presents with   Right Knee - Pain   Right Ankle - Pain    HPI: Jose Dennis is a 22 y.o. male who presents to the office reporting right knee and right ankle pain.  Patient has history of left knee ACL reconstruction.  He was playing soccer about 2.5 weeks ago and injured his right knee and right ankle with a knee hyperextension injury with rolling his ankle after landing.  He describes no popping sensation at that time.  Did have mild swelling at the time of injury.  No bruising that he noticed.  He has no instability in his right ankle or knee.  No history of prior surgery to either joint.  He has tried IcyHot and rest for pain relief.  Does have mild to moderate improvement since the date of injury.  No immediate swelling of the right knee.  He has been ambulatory full weightbearing without brace or crutches.  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  In his free time he enjoys playing basketball, soccer, weightlifting..                ROS: All systems reviewed are negative as they relate to the chief complaint within the history of present illness.  Patient denies fevers or chills.  Assessment & Plan: Visit Diagnoses:  1. Right knee pain, unspecified chronicity   2. Pain in right ankle and joints of right foot     Plan: Patient is a 22 year old male who presents for evaluation of right ankle and right knee pain.  Sustained injury about 2.5 weeks ago.  He has been full weightbearing since then.  Right knee radiographs are negative for any significant radiographic abnormality.  He has no positive findings on right knee exam.  However, right ankle and right foot radiographs  taken today demonstrate avulsion fracture of the navicular consistent with his exam with mild pain with posterior tib stressing.  However, with rapid improvement of his symptoms, no need for bracing.  Will just plan on activity modification and cautioned patient against any athletic activity more than walking.  We will reevaluate him in 3 to 4 weeks with Dr. August Saucer and need new radiographs at that time of the right foot.  Anticipate graduated return to activity at that point.  Follow-Up Instructions: No follow-ups on file.   Orders:  Orders Placed This Encounter  Procedures   XR KNEE 3 VIEW RIGHT   XR Ankle Complete Right   XR Foot Complete Right   No orders of the defined types were placed in this encounter.     Procedures: No procedures performed   Clinical Data: No additional findings.  Objective: Vital Signs: There were no vitals taken for this visit.  Physical Exam:  Constitutional: Patient appears well-developed HEENT:  Head: Normocephalic Eyes:EOM are normal Neck: Normal range of motion Cardiovascular: Normal rate Pulmonary/chest: Effort normal Neurologic: Patient is alert Skin: Skin is warm Psychiatric: Patient has normal mood and affect  Ortho Exam: Ortho exam demonstrates right knee without effusion.  No tenderness over the medial or lateral joint lines.  Stable to MCL  and LCL stressing.  Stable to anterior and posterior drawer sign.  Stable to Lachman exam.  He has excellent ability to perform straight leg raise without extensor lag.  He has range of motion from 0 degrees extension of 120 degrees knee flexion.  No calf tenderness.  Negative Homans' sign.  No pain with hip range of motion.  Right ankle with intact ankle dorsiflexion, plantarflexion, inversion, eversion passively and actively.  He has mild tenderness over the navicular area.  Minimal tenderness over the ankle joint line.  No tenderness over the medial or lateral malleoli, Lisfranc complex, fifth  metatarsal base, Achilles tendon, Achilles tendon insertion.  Achilles tendon and anterior tibialis tendon are palpable and intact.  The posterior tib tendon is palpable and intact.  Does have some mild pain with testing posterior tib inversion strength.  No plantar ecchymosis noted.  No swelling or ecchymosis noted in general.  Specialty Comments:  No specialty comments available.  Imaging: No results found.   PMFS History: Patient Active Problem List   Diagnosis Date Noted   Acute lateral meniscus tear of left knee    Acute medial meniscal tear, left, subsequent encounter    Left ACL tear 06/09/2020   Pain in left knee 04/29/2020   Pain in left foot 07/07/2016   Past Medical History:  Diagnosis Date   Medical history non-contributory     Family History  Family history unknown: Yes    Past Surgical History:  Procedure Laterality Date   ANTERIOR CRUCIATE LIGAMENT REPAIR Left 09/24/2020   Procedure: LEFT KNEE ARTHROSCOPY, ACL RECONSTRUCTION WITH QUAD AUTOGRAFT;  Surgeon: Cammy Copa, MD;  Location: [facility] OR;  Service: Orthopedics;  Laterality: Left;   NO PAST SURGERIES     Social History   Occupational History   Not on file  Tobacco Use   Smoking status: Never   Smokeless tobacco: Never  Vaping Use   Vaping status: Some Days  Substance and Sexual Activity   Alcohol use: No   Drug use: No   Sexual activity: Not on file

## 2023-02-17 ENCOUNTER — Ambulatory Visit: Payer: Medicaid Other | Admitting: Surgical

## 2023-03-01 ENCOUNTER — Ambulatory Visit: Payer: Medicaid Other | Admitting: Surgical

## 2023-03-09 ENCOUNTER — Ambulatory Visit (INDEPENDENT_AMBULATORY_CARE_PROVIDER_SITE_OTHER): Payer: Medicaid Other | Admitting: Surgical

## 2023-03-09 ENCOUNTER — Other Ambulatory Visit (INDEPENDENT_AMBULATORY_CARE_PROVIDER_SITE_OTHER): Payer: Medicaid Other

## 2023-03-09 DIAGNOSIS — M25571 Pain in right ankle and joints of right foot: Secondary | ICD-10-CM

## 2023-03-12 ENCOUNTER — Encounter: Payer: Self-pay | Admitting: Surgical

## 2023-03-12 NOTE — Progress Notes (Signed)
Office Visit Note   Patient: Jose Dennis           Date of Birth: [birth year]           MRN: [id number] Visit Date: 03/09/2023 Requested by: No referring provider defined for this encounter. PCP: Pcp, No  Subjective: Chief Complaint  Patient presents with   Right Foot - Follow-up    HPI: Jose Dennis is a 22 y.o. male who presents to the office reporting right foot pain.  Has history of right foot pain over the last 2 months that has previously been evaluated with radiographs demonstrating avulsion fracture of the navicular.  Since that last visit he states that he has avoided a lot of athletic activities except he did start jogging last week.  He has been shooting around on the basketball court but not playing any pickup games.  He notes primarily pain in the posterior medial and anterior lateral aspects of the right ankle now instead of the medial midfoot pain he was having previously.  He states that his pain is about 50% improved.  No mechanical symptoms or instability of the ankle.  Has never had surgery on this ankle..                ROS: All systems reviewed are negative as they relate to the chief complaint within the history of present illness.  Patient denies fevers or chills.  Assessment & Plan: Visit Diagnoses:  1. Pain in right ankle and joints of right foot     Plan: Patient is a 22 year old male who presents for evaluation of right foot and ankle pain.  Primarily notes posterior medial and anterior lateral ankle pain which is different compared with his previous area of maximal pain but still bothersome enough that he has not returned to full activity yet.  About 50% improvement over the last 2 months.  Pain does not wake him up at night.  The suspected avulsion fracture of the navicular looks to have callus formation noted on radiographs today.  He has no tenderness over the site whatsoever on exam.  With continued pain in an athletic young man who likes to  play sports, plan for further evaluation with MRI of the right ankle to evaluate the posterior medial ankle pain.  Follow-up after MRI to review results.  Follow-Up Instructions: No follow-ups on file.   Orders:  Orders Placed This Encounter  Procedures   XR Foot Complete Right   MR Ankle Right w/o contrast   No orders of the defined types were placed in this encounter.     Procedures: No procedures performed   Clinical Data: No additional findings.  Objective: Vital Signs: There were no vitals taken for this visit.  Physical Exam:  Constitutional: Patient appears well-developed HEENT:  Head: Normocephalic Eyes:EOM are normal Neck: Normal range of motion Cardiovascular: Normal rate Pulmonary/chest: Effort normal Neurologic: Patient is alert Skin: Skin is warm Psychiatric: Patient has normal mood and affect  Ortho Exam: Ortho exam demonstrates right ankle with intact ankle dorsiflexion, plantarflexion, inversion, eversion.  Has tenderness over the posterior medial aspect of the ankle behind the medial malleolus.  No tenderness over the navicular.  No tenderness over the ATFL or CFL ligaments.  No instability to anterior drawer test of the ankle.  Palpable DP pulse of the right lower extremity.  No swelling or bruising noted.  No plantar ecchymosis.  Anterior tibialis tendon is palpable and intact.  Posterior tib tendon is palpable and intact.  Achilles tendon is palpable and intact.  Specialty Comments:  No specialty comments available.  Imaging: No results found.   PMFS History: Patient Active Problem List   Diagnosis Date Noted   Acute lateral meniscus tear of left knee    Acute medial meniscal tear, left, subsequent encounter    Left ACL tear 06/09/2020   Pain in left knee 04/29/2020   Pain in left foot 07/07/2016   Past Medical History:  Diagnosis Date   Medical history non-contributory     Family History  Family history unknown: Yes    Past Surgical  History:  Procedure Laterality Date   ANTERIOR CRUCIATE LIGAMENT REPAIR Left 09/24/2020   Procedure: LEFT KNEE ARTHROSCOPY, ACL RECONSTRUCTION WITH QUAD AUTOGRAFT;  Surgeon: Cammy Copa, MD;  Location: [facility] OR;  Service: Orthopedics;  Laterality: Left;   NO PAST SURGERIES     Social History   Occupational History   Not on file  Tobacco Use   Smoking status: Never   Smokeless tobacco: Never  Vaping Use   Vaping status: Some Days  Substance and Sexual Activity   Alcohol use: No   Drug use: No   Sexual activity: Not on file

## 2023-03-23 IMAGING — DX DG CHEST 2V
2 series · 2 of 2 positions shown · non-contrast
Comparison: Concurrent rib exam.

CLINICAL DATA: Right-sided chest pain. Pain when taking deep
breaths. Recent ACL repair.

EXAM:
CHEST - 2 VIEW

[chest pa]
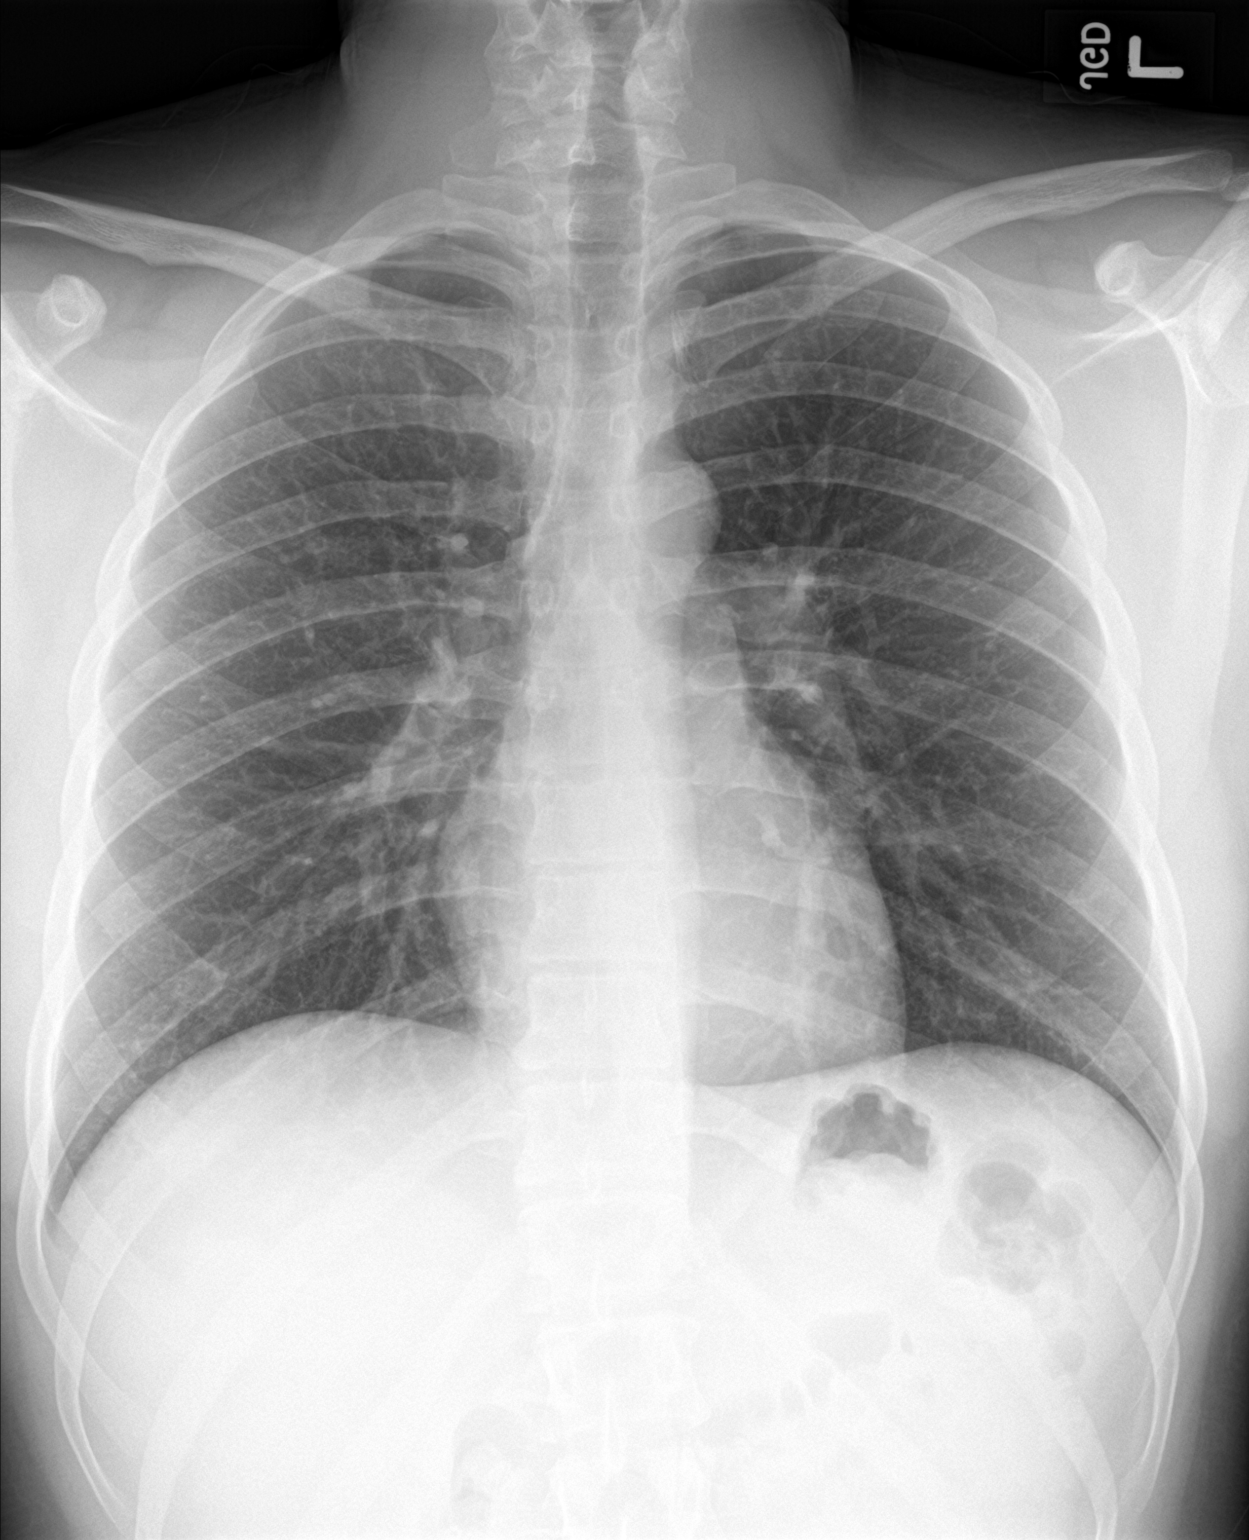

[chest lat]
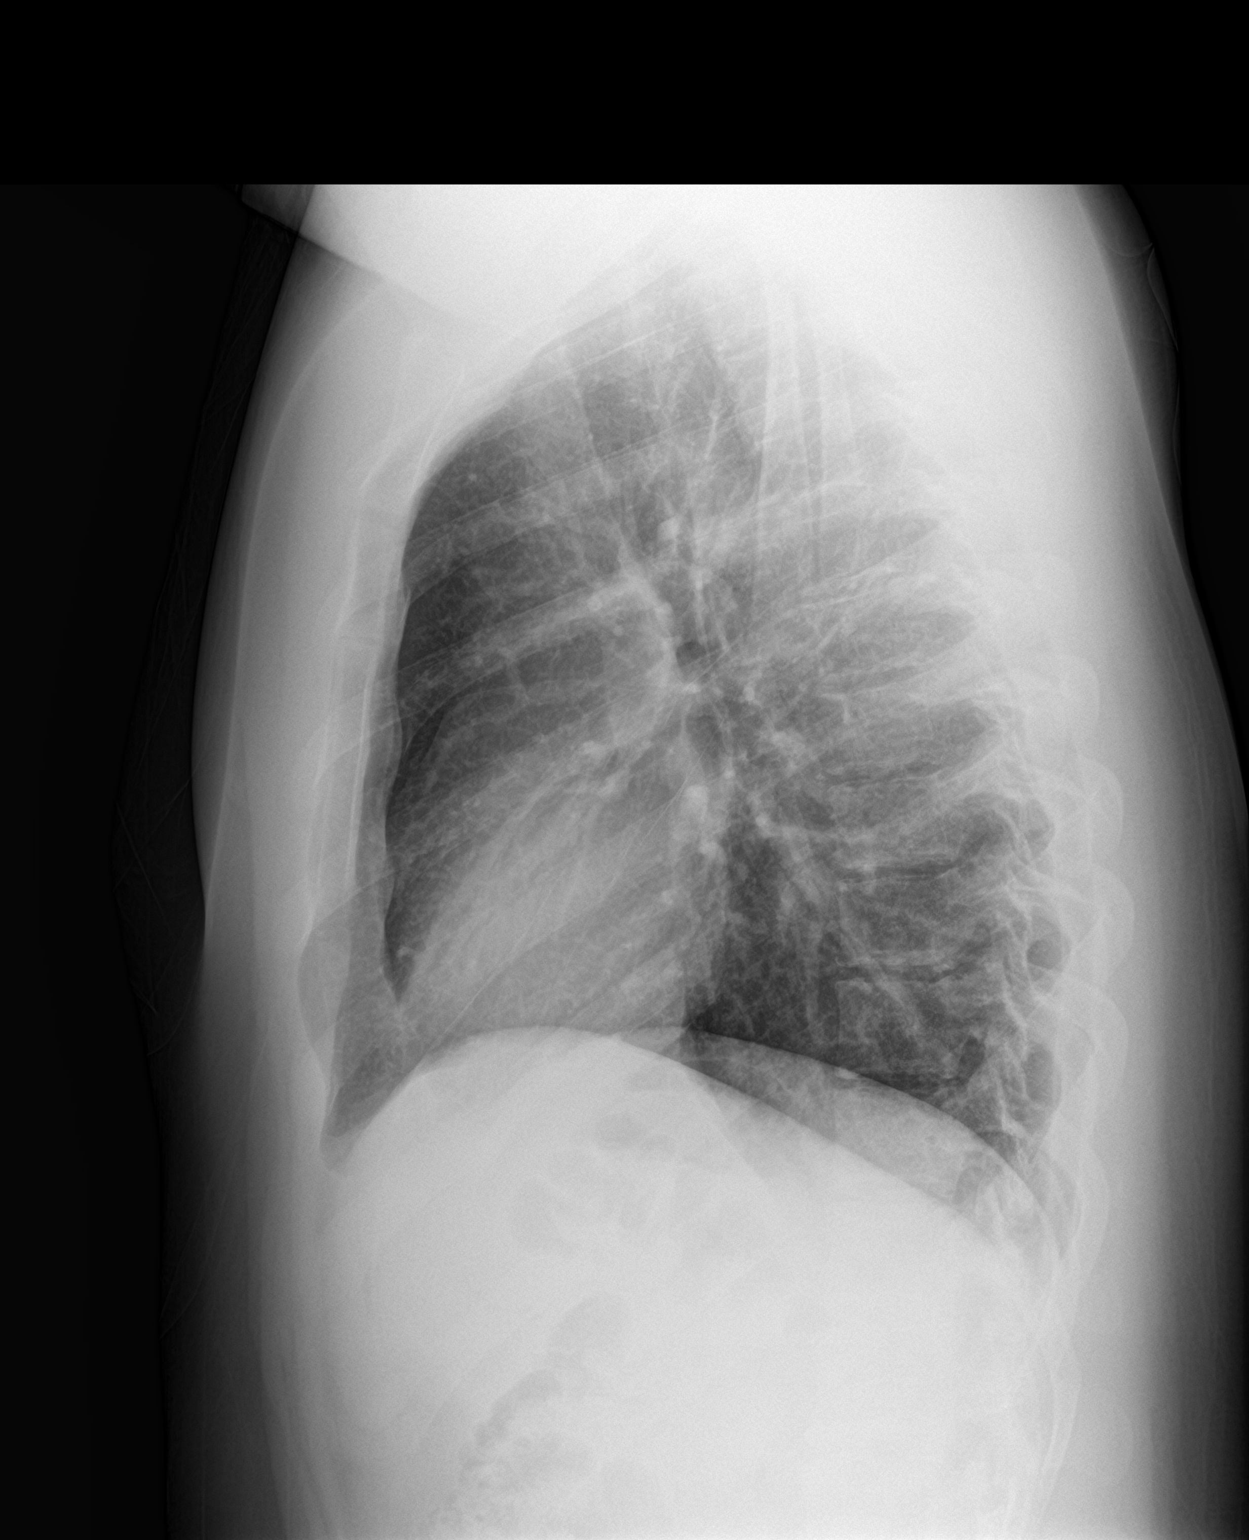

[2 of 2 positions shown; findings below may reference images not displayed]

FINDINGS: The cardiomediastinal contours are normal. The lungs are clear.
Pulmonary vasculature is normal. No consolidation, pleural effusion,
or pneumothorax. No acute osseous abnormalities are seen.
IMPRESSION: Negative radiographs of the chest.

## 2023-03-23 IMAGING — DX DG RIBS 2V*R*
4 series · 4 of 4 positions shown · non-contrast
Comparison: Concurrent chest radiographs.

CLINICAL DATA: Right-sided rib pain. Pain when taking deep breaths.
Recent ACL repair.

EXAM:
RIGHT RIBS - 2 VIEW

[rib obl (1 of 2)]
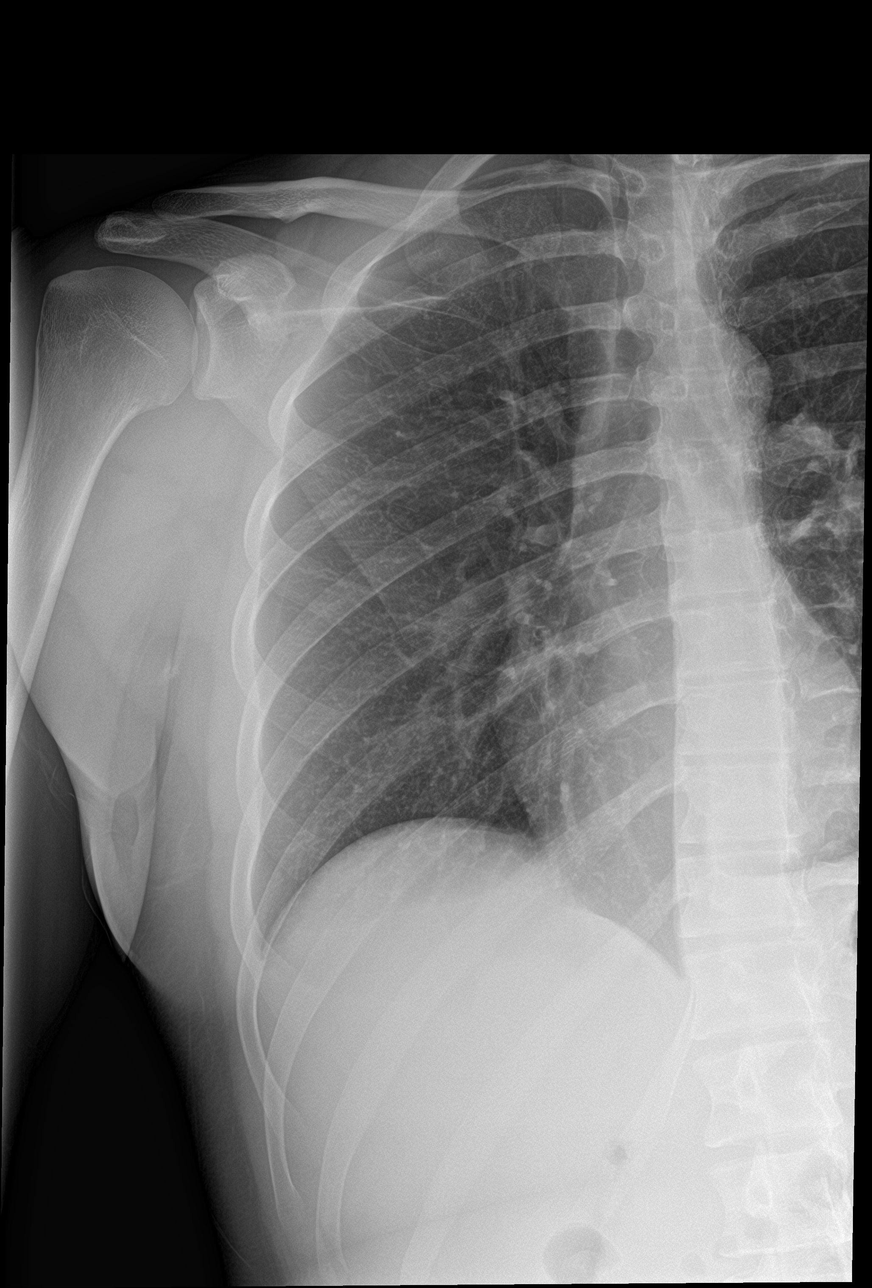

[rib obl (2 of 2)]
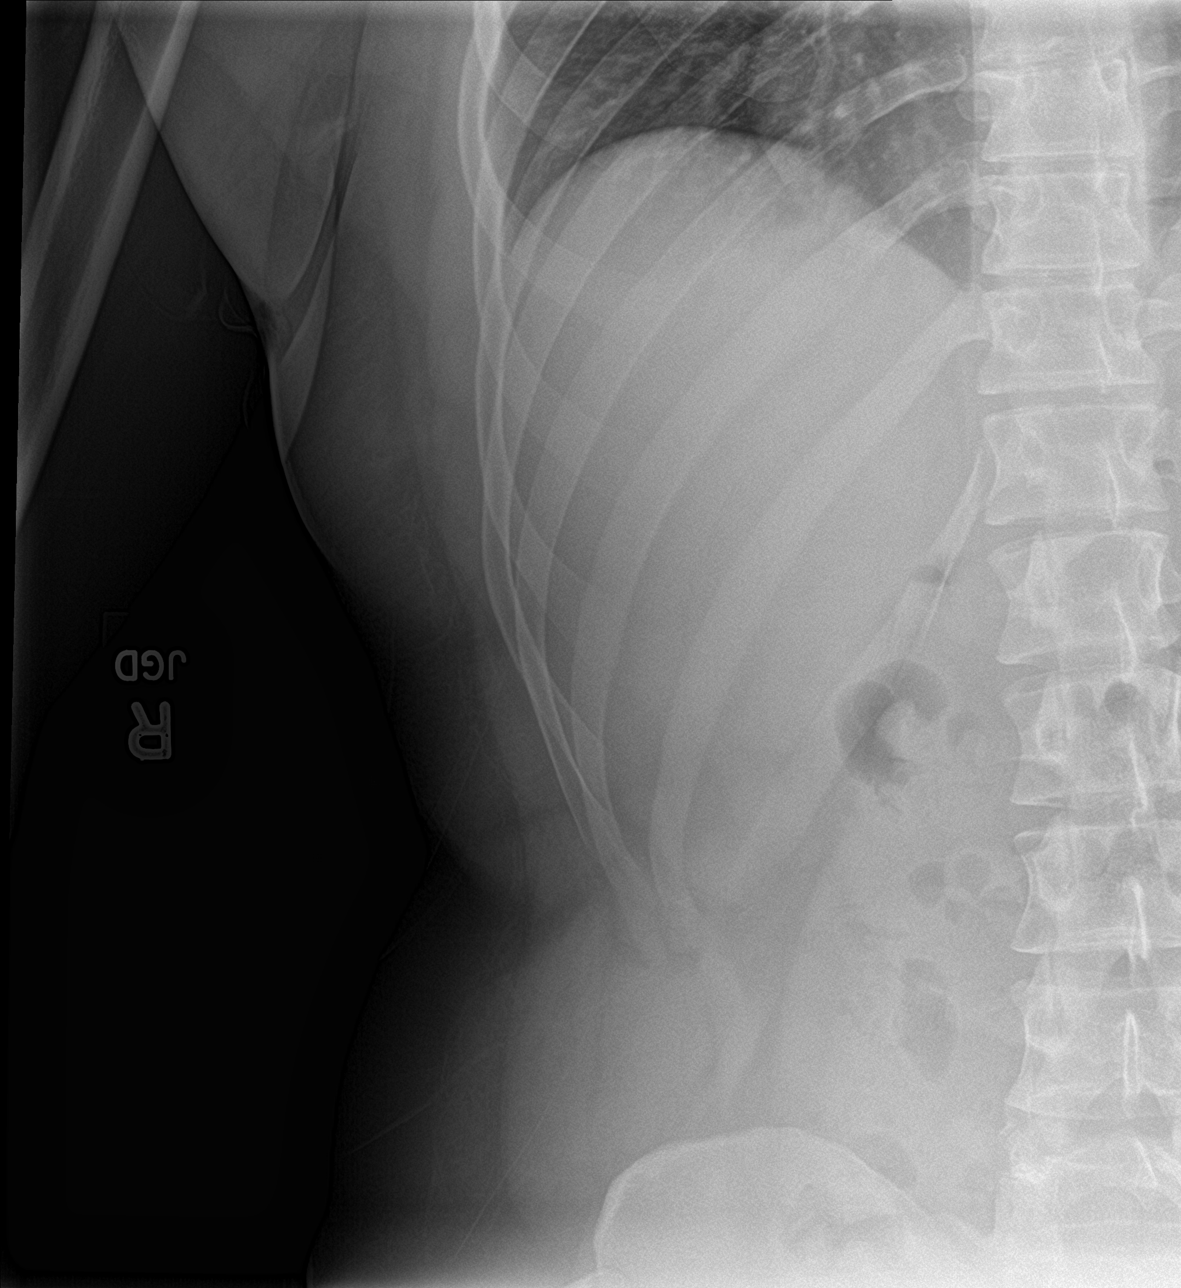

[rib ap (1 of 2)]
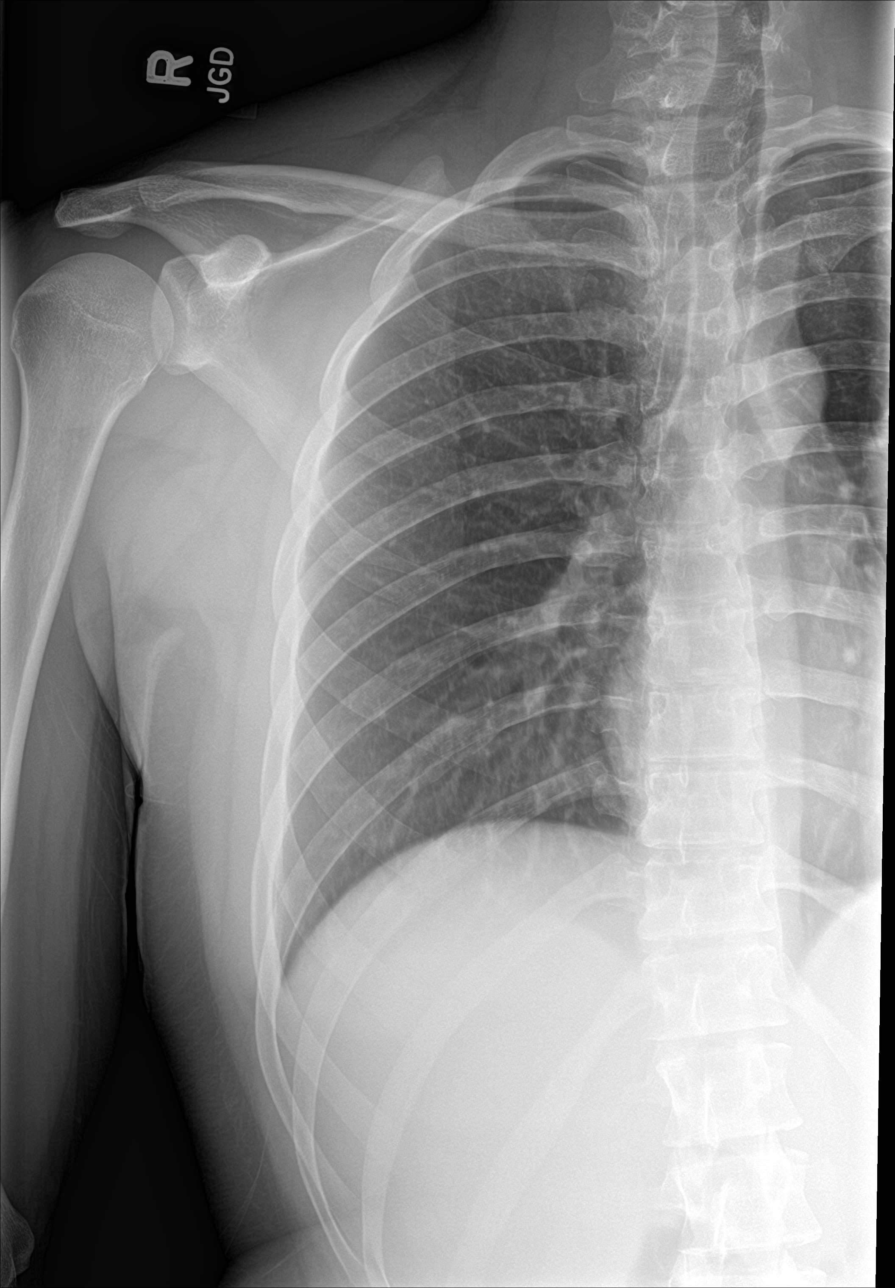

[rib ap (2 of 2)]
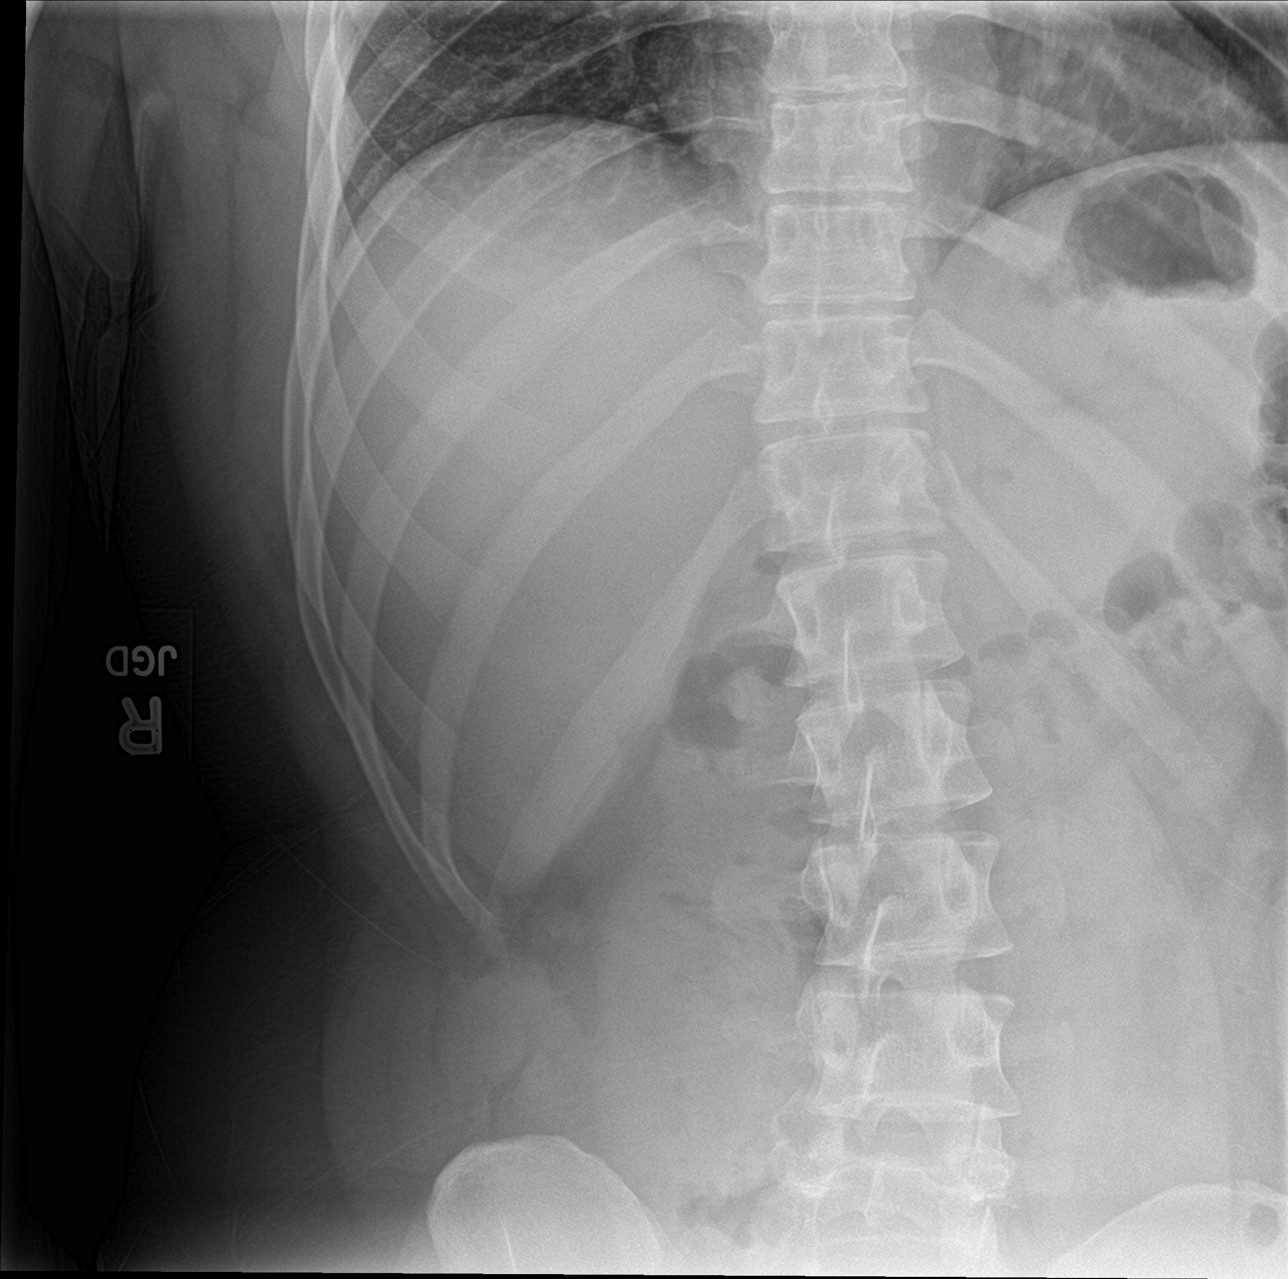

[4 of 4 positions shown; findings below may reference images not displayed]

FINDINGS: No fracture or other bone lesions are seen involving the ribs.
IMPRESSION: Negative radiographs of the right ribs.

## 2023-04-10 ENCOUNTER — Other Ambulatory Visit: Payer: Self-pay

## 2023-04-25 ENCOUNTER — Telehealth: Payer: Self-pay | Admitting: Surgical

## 2023-04-25 NOTE — Telephone Encounter (Signed)
Called patient to schedule mri review with [facility]. Left message to give [place] a call back.

## 2023-05-05 ENCOUNTER — Ambulatory Visit
Admission: RE | Admit: 2023-05-05 | Discharge: 2023-05-05 | Disposition: A | Discharge status: Home / Self Care | Payer: Medicaid Other | Source: Ambulatory Visit | Attending: Surgical

## 2023-05-05 DIAGNOSIS — M25571 Pain in right ankle and joints of right foot: Secondary | ICD-10-CM

## 2023-05-22 ENCOUNTER — Telehealth: Payer: Self-pay

## 2023-05-22 NOTE — Telephone Encounter (Signed)
-----   Message from [facility] sent at 05/22/2023  8:56 AM EST ----- Can we have patient follow-up with Dr August Saucer? Doesn't look like he has follow-up

## 2023-05-22 NOTE — Telephone Encounter (Signed)
 Please schedule patient for MRI review.

## 2023-06-14 ENCOUNTER — Ambulatory Visit (INDEPENDENT_AMBULATORY_CARE_PROVIDER_SITE_OTHER): Payer: Medicaid Other | Admitting: Orthopedic Surgery

## 2023-06-14 DIAGNOSIS — M25571 Pain in right ankle and joints of right foot: Secondary | ICD-10-CM

## 2023-06-15 ENCOUNTER — Encounter: Payer: Self-pay | Admitting: Orthopedic Surgery

## 2023-06-15 NOTE — Progress Notes (Signed)
 Office Visit Note   Patient: Jose Dennis           Date of Birth: 08-09-2000           MRN: [id number] Visit Date: 06/14/2023 Requested by: No referring provider defined for this encounter. PCP: Pcp, No  Subjective: Chief Complaint  Patient presents with   Other    Review MRI    HPI: Jose Dennis is a 23 y.o. male who presents to the office reporting right ankle and foot pain.  Since he was last seen he has had MRI of the right ankle.  Patient is not really able to play soccer but he is able to do most other active types of activities.  Describes pain in the posterior medial aspect of the ankle worse with plantarflexion.  Uses lidocaine  cream.  Hurts for him to run full speed particularly in the plantarflexion aspect of his gait.  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  He is okay playing basketball but cannot really jump to his maximum..                ROS: All systems reviewed are negative as they relate to the chief complaint within the history of present illness.  Patient denies fevers or chills.  Assessment & Plan: Visit Diagnoses:  1. Pain in right ankle and joints of right foot     Plan: Impression is symptomatic os trigonum syndrome.  MRI scan of the ankle does show edema within the adjacent os trigonum of the posterior facet of the calcaneus.  Fair amount of focal subchondral edema is present and that is shown to the patient.  He does have excellent posterior tib tendon strength.  Plan at this time is we talked about activity modification as well as surgical excision of that office.  Risk and benefits are discussed.  I will have him see you Dr. Harden who is her foot and ankle specialist in the practice for that procedure.  In general soccer is the biggest activity that he wants to do that he cannot do because of the symptoms.  I think he takes a little bit more time off and negative better.  If not he may have to consider excision.  We will  see him back as needed.  Follow-Up Instructions: No follow-ups on file.   Orders:  No orders of the defined types were placed in this encounter.  No orders of the defined types were placed in this encounter.     Procedures: No procedures performed   Clinical Data: No additional findings.  Objective: Vital Signs: There were no vitals taken for this visit.  Physical Exam:  Constitutional: Patient appears well-developed HEENT:  Head: Normocephalic Eyes:EOM are normal Neck: Normal range of motion Cardiovascular: Normal rate Pulmonary/chest: Effort normal Neurologic: Patient is alert Skin: Skin is warm Psychiatric: Patient has normal mood and affect  Ortho Exam: Ortho exam demonstrates palpable nontender intact anterior to posterior to peroneal and Achilles tendons.  He has excellent posterior tib strength with inversion in both feet.  Will bit of tenderness in that posterior calcaneal region with plantarflexion.  Tibiotalar subtalar transverse tarsal range of motion is symmetric and intact between both feet.  Specialty Comments:  No specialty comments available.  Imaging: No results found.   PMFS History: Patient Active Problem List   Diagnosis Date Noted   Acute lateral meniscus tear of left knee    Acute medial meniscal tear, left,  subsequent encounter    Left ACL tear 06/09/2020   Pain in left knee 04/29/2020   Pain in left foot 07/07/2016   Past Medical History:  Diagnosis Date   Medical history non-contributory     Family History  Family history unknown: Yes    Past Surgical History:  Procedure Laterality Date   ANTERIOR CRUCIATE LIGAMENT REPAIR Left 09/24/2020   Procedure: LEFT KNEE ARTHROSCOPY, ACL RECONSTRUCTION WITH QUAD AUTOGRAFT;  Surgeon: Addie Cordella Hamilton, MD;  Location: [facility] OR;  Service: Orthopedics;  Laterality: Left;   NO PAST SURGERIES     Social History   Occupational History   Not on file  Tobacco Use   Smoking status: Never    Smokeless tobacco: Never  Vaping Use   Vaping status: Some Days  Substance and Sexual Activity   Alcohol use: No   Drug use: No   Sexual activity: Not on file

## 2024-03-11 ENCOUNTER — Encounter: Payer: Self-pay | Admitting: Radiology
